# Patient Record
Sex: Male | Born: 1965 | Race: White | Hispanic: No | Marital: Married | Smoking: Former smoker
Health system: Southern US, Community
[De-identification: ages and names within clinical notes are randomized; demographics above are authoritative.]

## PROBLEM LIST (undated history)

## (undated) DIAGNOSIS — I10 Essential (primary) hypertension: Secondary | ICD-10-CM

## (undated) DIAGNOSIS — E785 Hyperlipidemia, unspecified: Secondary | ICD-10-CM

## (undated) DIAGNOSIS — K469 Unspecified abdominal hernia without obstruction or gangrene: Secondary | ICD-10-CM

## (undated) DIAGNOSIS — K219 Gastro-esophageal reflux disease without esophagitis: Secondary | ICD-10-CM

## (undated) HISTORY — DX: Essential (primary) hypertension: I10

## (undated) HISTORY — DX: Unspecified abdominal hernia without obstruction or gangrene: K46.9

## (undated) HISTORY — PX: COLONOSCOPY: SHX174

## (undated) HISTORY — DX: Hyperlipidemia, unspecified: E78.5

## (undated) HISTORY — PX: OTHER SURGICAL HISTORY: SHX169

## (undated) HISTORY — DX: Gastro-esophageal reflux disease without esophagitis: K21.9

## (undated) HISTORY — PX: NOSE SURGERY: SHX723

---

## 1998-10-27 ENCOUNTER — Emergency Department (HOSPITAL_COMMUNITY): Admission: EM | Admit: 1998-10-27 | Discharge: 1998-10-27 | Payer: Self-pay | Admitting: Emergency Medicine

## 1999-11-19 ENCOUNTER — Ambulatory Visit (HOSPITAL_COMMUNITY): Admission: RE | Admit: 1999-11-19 | Discharge: 1999-11-19 | Payer: Self-pay | Admitting: Gastroenterology

## 2001-01-24 ENCOUNTER — Encounter: Admission: RE | Admit: 2001-01-24 | Discharge: 2001-01-24 | Payer: Self-pay | Admitting: Otolaryngology

## 2001-01-24 ENCOUNTER — Encounter: Payer: Self-pay | Admitting: Otolaryngology

## 2001-04-06 ENCOUNTER — Other Ambulatory Visit: Admission: RE | Admit: 2001-04-06 | Discharge: 2001-04-06 | Payer: Self-pay | Admitting: Otolaryngology

## 2002-11-08 ENCOUNTER — Encounter: Payer: Self-pay | Admitting: Family Medicine

## 2002-11-08 ENCOUNTER — Encounter: Admission: RE | Admit: 2002-11-08 | Discharge: 2002-11-08 | Payer: Self-pay | Admitting: Family Medicine

## 2004-06-11 ENCOUNTER — Emergency Department (HOSPITAL_COMMUNITY): Admission: EM | Admit: 2004-06-11 | Discharge: 2004-06-11 | Payer: Self-pay | Admitting: Emergency Medicine

## 2015-03-09 ENCOUNTER — Ambulatory Visit (INDEPENDENT_AMBULATORY_CARE_PROVIDER_SITE_OTHER): Payer: BLUE CROSS/BLUE SHIELD | Admitting: Emergency Medicine

## 2015-03-09 VITALS — BP 142/92 | HR 94 | Temp 97.7°F | Resp 18

## 2015-03-09 DIAGNOSIS — M79641 Pain in right hand: Secondary | ICD-10-CM

## 2015-03-09 DIAGNOSIS — S60551A Superficial foreign body of right hand, initial encounter: Secondary | ICD-10-CM | POA: Diagnosis not present

## 2015-03-09 NOTE — Patient Instructions (Signed)

## 2015-03-09 NOTE — Progress Notes (Signed)
[facility] [address] [place] [address] [phone]  Date:  03/09/2015   Name:  Edgar Hall   DOB:  1966/10/19   MRN:  [id number]  PCP:  No primary care provider on file.    Chief Complaint: fishing hook in hand   History of Present Illness:  EPIMENIO SCHETTER is a 49 y.o. very pleasant male patient who presents with the following:  Has fishhook in right hand. Current on tetanus No improvement with over the counter medications or other home remedies.  Denies other complaint or health concern today.   There are no active problems to display for this patient.   Past Medical History  Diagnosis Date  . Hypertension     Past Surgical History  Procedure Laterality Date  . Arm surgery    . Nose surgery      History  Substance Use Topics  . Smoking status: Former Research scientist (life sciences)  . Smokeless tobacco: Not on file  . Alcohol Use: No    History reviewed. No pertinent family history.  No Known Allergies  Medication list has been reviewed and updated.  No current outpatient prescriptions on file prior to visit.   No current facility-administered medications on file prior to visit.    Review of Systems:  As per HPI, otherwise negative.    Physical Examination: Filed Vitals:   03/09/15 0920  BP: 142/92  Pulse: 94  Temp: 97.7 F (36.5 C)  Resp: 18   There were no vitals filed for this visit. There is no height or weight on file to calculate BMI. Ideal Body Weight:    . GEN: WDWN, NAD, Non-toxic, Alert & Oriented x 3 HEENT: Atraumatic, Normocephalic.  Ears and Nose: No external deformity. EXTR: No clubbing/cyanosis/edema NEURO: Normal gait.  PSYCH: Normally interactive. Conversant. Not depressed or anxious appearing.  Calm demeanor.  Right hand:  FB embedded in right hand palmar surface between 4-5th fingers.  NATI   Assessment and Plan: FB hand Removed atraumatically by needle technique   Signed,  Ellison Carwin,  MD

## 2017-05-25 ENCOUNTER — Encounter: Payer: Self-pay | Admitting: Internal Medicine

## 2017-06-29 ENCOUNTER — Ambulatory Visit: Payer: Self-pay | Admitting: Internal Medicine

## 2018-03-04 ENCOUNTER — Encounter: Payer: Self-pay | Admitting: Gastroenterology

## 2018-04-19 ENCOUNTER — Ambulatory Visit: Payer: BLUE CROSS/BLUE SHIELD | Admitting: Gastroenterology

## 2018-04-19 ENCOUNTER — Encounter: Payer: Self-pay | Admitting: Gastroenterology

## 2018-04-19 VITALS — BP 116/80 | HR 84 | Ht 66.0 in | Wt 186.4 lb

## 2018-04-19 DIAGNOSIS — Z800 Family history of malignant neoplasm of digestive organs: Secondary | ICD-10-CM | POA: Diagnosis not present

## 2018-04-19 DIAGNOSIS — K921 Melena: Secondary | ICD-10-CM

## 2018-04-19 DIAGNOSIS — L309 Dermatitis, unspecified: Secondary | ICD-10-CM

## 2018-04-19 MED ORDER — NA SULFATE-K SULFATE-MG SULF 17.5-3.13-1.6 GM/177ML PO SOLN: 1.0000 | Freq: Once | ORAL | 0 refills | Status: AC

## 2018-04-19 MED ORDER — CLOTRIMAZOLE 1 % EX CREA: 1.0000 "application " | TOPICAL_CREAM | Freq: Two times a day (BID) | CUTANEOUS | 0 refills | Status: DC

## 2018-04-19 NOTE — Patient Instructions (Signed)
We have sent the following medications to your pharmacy for you to pick up at your convenience: Lotrimin to apply rectally 2-3 x daily x 10 days.  You have been scheduled for a colonoscopy. Please follow written instructions given to you at your visit today.  Please pick up your prep supplies at the pharmacy within the next 1-3 days. If you use inhalers (even only as needed), please bring them with you on the day of your procedure. Your physician has requested that you go to www.startemmi.com and enter the access code given to you at your visit today. This web site gives a general overview about your procedure. However, you should still follow specific instructions given to you by our office regarding your preparation for the procedure.  Normal BMI (Body Mass Index- based on height and weight) is between 19 and 25. Your BMI today is Body mass index is 30.09 kg/m. Marland Kitchen Please consider follow up  regarding your BMI with your Primary Care Provider.  Thank you for choosing me and [facility].  Pricilla Riffle. Dagoberto Ligas., MD., Marval Regal

## 2018-04-19 NOTE — Progress Notes (Signed)
History of Present Illness: This is a 52 year old male referred by Loyola Mast, PA-C for the evaluation of hematochezia, family history of colon cancer.  He relates hemorrhoids being diagnosed at colonoscopy about 15 years ago performed by Dr. Collene Mares.  Over the past few years he has had intermittent rectal itching and intermittent scant amounts of blood on tissue paper and these symptoms have not changed.  For the past few months he has noted perianal irritation and itching.  His father developed colon cancer at age 59. Denies weight loss, abdominal pain, constipation, diarrhea, change in stool caliber, melena, nausea, vomiting, dysphagia, reflux symptoms, chest pain.     No Known Allergies Outpatient Medications Prior to Visit  Medication Sig Dispense Refill  . aspirin EC 81 MG tablet Take 81 mg by mouth daily.    . Cholecalciferol (VITAMIN D PO) Take by mouth.    . fluticasone (FLONASE) 50 MCG/ACT nasal spray Place into both nostrils daily.    Marland Kitchen lisinopril-hydrochlorothiazide (PRINZIDE,ZESTORETIC) 20-25 MG per tablet Take 1 tablet by mouth daily.    . Multiple Vitamin (MULTIVITAMIN) tablet Take 1 tablet by mouth daily.    Marland Kitchen omeprazole (PRILOSEC) 40 MG capsule Take 40 mg by mouth daily.    . simvastatin (ZOCOR) 10 MG tablet Take 10 mg by mouth daily.     No facility-administered medications prior to visit.    Past Medical History:  Diagnosis Date  . Abdominal hernia   . GERD (gastroesophageal reflux disease)   . Hyperlipidemia   . Hypertension    Past Surgical History:  Procedure Laterality Date  . arm surgery    . NOSE SURGERY     Social History   Socioeconomic History  . Marital status: Married    Spouse name: Not on file  . Number of children: 1  . Years of education: Not on file  . Highest education level: Not on file  Occupational History  . Not on file  Social Needs  . Financial resource strain: Not on file  . Food insecurity:    Worry: Not on file   Inability: Not on file  . Transportation needs:    Medical: Not on file    Non-medical: Not on file  Tobacco Use  . Smoking status: Former Research scientist (life sciences)  . Smokeless tobacco: Never Used  Substance and Sexual Activity  . Alcohol use: Yes    Alcohol/week: 0.0 oz  . Drug use: Not Currently  . Sexual activity: Not on file  Lifestyle  . Physical activity:    Days per week: Not on file    Minutes per session: Not on file  . Stress: Not on file  Relationships  . Social connections:    Talks on phone: Not on file    Gets together: Not on file    Attends religious service: Not on file    Active member of club or organization: Not on file    Attends meetings of clubs or organizations: Not on file    Relationship status: Not on file  Other Topics Concern  . Not on file  Social History Narrative  . Not on file   Family History  Problem Relation Age of Onset  . Colon cancer Father   . Diabetes Father   . Heart disease Father   . COPD Mother        Review of Systems: Pertinent positive and negative review of systems were noted in the above HPI section. All other review  of systems were otherwise negative.  Physical Exam: General: Well developed, well nourished, no acute distress Head: Normocephalic and atraumatic Eyes:  sclerae anicteric, EOMI Ears: Normal auditory acuity Mouth: No deformity or lesions Neck: Supple, no masses or thyromegaly Lungs: Clear throughout to auscultation Heart: Regular rate and rhythm; no murmurs, rubs or bruits Abdomen: Soft, non tender and non distended. No masses, hepatosplenomegaly or hernias noted. Normal Bowel sounds Rectal: Perianal erythema extending circumferentially about 5 cm from the anus with 2 linear skin fissures, no anal fissure, no internal lesions or tenderness, Hemoccult negative brown stool. Musculoskeletal: Symmetrical with no gross deformities  Skin: No lesions on visible extremities Pulses:  Normal pulses noted Extremities: No  clubbing, cyanosis, edema or deformities noted Neurological: Alert oriented x 4, grossly nonfocal Cervical Nodes:  No significant cervical adenopathy Inguinal Nodes: No significant inguinal adenopathy Psychological:  Alert and cooperative. Normal mood and affect  Assessment and Recommendations:  1. Family history of colon cancer, father at age 62.  Patient turns 52 in June.  Schedule colonoscopy. The risks (including bleeding, perforation, infection, missed lesions, medication reactions and possible hospitalization or surgery if complications occur), benefits, and alternatives to colonoscopy with possible biopsy and possible polypectomy were discussed with the patient and they consent to proceed.   2. Hematochezia, intermittent and small-volume.  Suspected benign anorectal source such as hemorrhoids.  3. Perianal dermatiti - fungal dermatitis.  Lotrimin AF twice daily to 3 times daily for 10 days.   cc: Loyola Mast, PA-C [place], [place] [id number]

## 2018-06-28 ENCOUNTER — Encounter: Payer: Self-pay | Admitting: Gastroenterology

## 2018-06-28 ENCOUNTER — Ambulatory Visit (AMBULATORY_SURGERY_CENTER): Payer: BLUE CROSS/BLUE SHIELD | Admitting: Gastroenterology

## 2018-06-28 VITALS — BP 128/90 | HR 77 | Temp 98.7°F | Resp 16 | Ht 66.0 in | Wt 186.0 lb

## 2018-06-28 DIAGNOSIS — D120 Benign neoplasm of cecum: Secondary | ICD-10-CM

## 2018-06-28 DIAGNOSIS — D124 Benign neoplasm of descending colon: Secondary | ICD-10-CM

## 2018-06-28 DIAGNOSIS — Z1211 Encounter for screening for malignant neoplasm of colon: Secondary | ICD-10-CM | POA: Diagnosis present

## 2018-06-28 DIAGNOSIS — Z800 Family history of malignant neoplasm of digestive organs: Secondary | ICD-10-CM

## 2018-06-28 DIAGNOSIS — K635 Polyp of colon: Secondary | ICD-10-CM

## 2018-06-28 MED ORDER — SODIUM CHLORIDE 0.9 % IV SOLN: 500.0000 mL | Freq: Once | INTRAVENOUS | Status: DC

## 2018-06-28 NOTE — Progress Notes (Signed)
Called to room to assist during endoscopic procedure.  Patient ID and intended procedure confirmed with present staff. Received instructions for my participation in the procedure from the performing physician.  

## 2018-06-28 NOTE — Op Note (Signed)
[facility] Patient Name: Edgar Hall Procedure Date: 06/28/2018 11:30 AM MRN: [id number] Endoscopist: Ladene Artist , MD Age: 52 Referring MD:  Date of Birth: 02-21-1966 Gender: Male Account #: [id number] Procedure:                Colonoscopy Indications:              Screening in patient at increased risk: Family                            history of 1st-degree relative with colorectal                            cancer Medicines:                Monitored Anesthesia Care Procedure:                Pre-Anesthesia Assessment:                           - Prior to the procedure, a History and Physical                            was performed, and patient medications and                            allergies were reviewed. The patient's tolerance of                            previous anesthesia was also reviewed. The risks                            and benefits of the procedure and the sedation                            options and risks were discussed with the patient.                            All questions were answered, and informed consent                            was obtained. Prior Anticoagulants: The patient has                            taken no previous anticoagulant or antiplatelet                            agents. ASA Grade Assessment: II - A patient with                            mild systemic disease. After reviewing the risks                            and benefits, the patient was deemed in  satisfactory condition to undergo the procedure.                           After obtaining informed consent, the colonoscope                            was passed under direct vision. Throughout the                            procedure, the patient's blood pressure, pulse, and                            oxygen saturations were monitored continuously. The                            Model PCF-[id number]) scope was introduced                      through the anus and advanced to the the cecum,                            identified by appendiceal orifice and ileocecal                            valve. The ileocecal valve, appendiceal orifice,                            and rectum were photographed. The quality of the                            bowel preparation was good. The colonoscopy was                            performed without difficulty. The patient tolerated                            the procedure well. Scope In: 11:35:20 AM Scope Out: 11:47:05 AM Scope Withdrawal Time: 0 hours 10 minutes 11 seconds  Total Procedure Duration: 0 hours 11 minutes 45 seconds  Findings:                 The perianal and digital rectal examinations were                            normal.                           Three sessile polyps were found in the descending                            colon (2) and cecum (1). The polyps were 6 to 7 mm                            in size. These polyps were removed with a cold  snare. Resection and retrieval were complete.                           A few small-mouthed diverticula were found in the                            left colon.                           Internal hemorrhoids were found during                            retroflexion. The hemorrhoids were small and Grade                            I (internal hemorrhoids that do not prolapse).                           The exam was otherwise without abnormality on                            direct and retroflexion views. Complications:            No immediate complications. Estimated blood loss:                            None. Estimated Blood Loss:     Estimated blood loss: none. Impression:               - Three 6 to 7 mm polyps in the descending colon                            and in the cecum, removed with a cold snare.                            Resected and retrieved.                           - Mild  left colon diverticulosis.                           - Internal hemorrhoids.                           - The examination was otherwise normal on direct                            and retroflexion views. Recommendation:           - Repeat colonoscopy in 5 years for surveillance.                           - Patient has a contact number available for                            emergencies. The signs and symptoms of potential  delayed complications were discussed with the                            patient. Return to normal activities tomorrow.                            Written discharge instructions were provided to the                            patient.                           - High fiber diet.                           - Continue present medications.                           - Await pathology results. Ladene Artist, MD 06/28/2018 11:50:44 AM This report has been signed electronically.

## 2018-06-28 NOTE — Patient Instructions (Signed)
Handouts Given: Polyps, and  Hemorrhoids.  YOU HAD AN ENDOSCOPIC PROCEDURE TODAY AT [facility]:   Refer to the procedure report that was given to you for any specific questions about what was found during the examination.  If the procedure report does not answer your questions, please call your gastroenterologist to clarify.  If you requested that your care partner not be given the details of your procedure findings, then the procedure report has been included in a sealed envelope for you to review at your convenience later.  YOU SHOULD EXPECT: Some feelings of bloating in the abdomen. Passage of more gas than usual.  Walking can help get rid of the air that was put into your GI tract during the procedure and reduce the bloating. If you had a lower endoscopy (such as a colonoscopy or flexible sigmoidoscopy) you may notice spotting of blood in your stool or on the toilet paper. If you underwent a bowel prep for your procedure, you may not have a normal bowel movement for a few days.  Please Note:  You might notice some irritation and congestion in your nose or some drainage.  This is from the oxygen used during your procedure.  There is no need for concern and it should clear up in a day or so.  SYMPTOMS TO REPORT IMMEDIATELY:   Following lower endoscopy (colonoscopy or flexible sigmoidoscopy):  Excessive amounts of blood in the stool  Significant tenderness or worsening of abdominal pains  Swelling of the abdomen that is new, acute  Fever of 100F or higher   For urgent or emergent issues, a gastroenterologist can be reached at any hour by calling [phone].   DIET:  We do recommend a small meal at first, but then you may proceed to your regular diet.  Drink plenty of fluids but you should avoid alcoholic beverages for 24 hours.  ACTIVITY:  You should plan to take it easy for the rest of today and you should NOT DRIVE or use heavy machinery until tomorrow (because of  the sedation medicines used during the test).    FOLLOW UP: Our staff will call the number listed on your records the next business day following your procedure to check on you and address any questions or concerns that you may have regarding the information given to you following your procedure. If we do not reach you, we will leave a message.  However, if you are feeling well and you are not experiencing any problems, there is no need to return our call.  We will assume that you have returned to your regular daily activities without incident.  If any biopsies were taken you will be contacted by phone or by letter within the next 1-3 weeks.  Please call us at [phone] if you have not heard about the biopsies in 3 weeks.    SIGNATURES/CONFIDENTIALITY: You and/or your care partner have signed paperwork which will be entered into your electronic medical record.  These signatures attest to the fact that that the information above on your After Visit Summary has been reviewed and is understood.  Full responsibility of the confidentiality of this discharge information lies with you and/or your care-partner.

## 2018-06-28 NOTE — Progress Notes (Signed)
A and O x3. Report to RN. Tolerated MAC anesthesia well.

## 2018-06-28 NOTE — Progress Notes (Signed)
Updated history with patient today.

## 2018-06-29 ENCOUNTER — Telehealth: Payer: Self-pay

## 2018-06-29 NOTE — Telephone Encounter (Signed)
  Follow up Call-  Call Edgar Hall number 06/28/2018  Post procedure Call [name] phone  # [phone]  Permission to leave phone message Yes  Some recent data might be hidden     Patient questions:  Do you have a fever, pain , or abdominal swelling? No. Pain Score  0 *  Have you tolerated food without any problems? Yes.    Have you been able to return to your normal activities? Yes.    Do you have any questions about your discharge instructions: Diet   No. Medications  No. Follow up visit  No.  Do you have questions or concerns about your Care? No.  Actions: * If pain score is 4 or above: No action needed, pain <4.

## 2018-07-05 ENCOUNTER — Encounter: Payer: Self-pay | Admitting: Gastroenterology

## 2019-01-21 DIAGNOSIS — I639 Cerebral infarction, unspecified: Secondary | ICD-10-CM

## 2019-01-21 HISTORY — DX: Cerebral infarction, unspecified: I63.9

## 2019-02-10 ENCOUNTER — Emergency Department (HOSPITAL_COMMUNITY): Payer: BLUE CROSS/BLUE SHIELD

## 2019-02-10 ENCOUNTER — Other Ambulatory Visit: Payer: Self-pay

## 2019-02-10 ENCOUNTER — Encounter (HOSPITAL_COMMUNITY): Payer: Self-pay | Admitting: Internal Medicine

## 2019-02-10 ENCOUNTER — Observation Stay (HOSPITAL_COMMUNITY)
Admission: EM | Admit: 2019-02-10 | Discharge: 2019-02-11 | Disposition: A | Discharge status: Home / Self Care | Payer: BLUE CROSS/BLUE SHIELD | Attending: Internal Medicine | Admitting: Internal Medicine

## 2019-02-10 DIAGNOSIS — K219 Gastro-esophageal reflux disease without esophagitis: Secondary | ICD-10-CM | POA: Diagnosis present

## 2019-02-10 DIAGNOSIS — E785 Hyperlipidemia, unspecified: Secondary | ICD-10-CM | POA: Diagnosis present

## 2019-02-10 DIAGNOSIS — R945 Abnormal results of liver function studies: Secondary | ICD-10-CM | POA: Diagnosis not present

## 2019-02-10 DIAGNOSIS — R0789 Other chest pain: Secondary | ICD-10-CM | POA: Diagnosis present

## 2019-02-10 DIAGNOSIS — I10 Essential (primary) hypertension: Secondary | ICD-10-CM | POA: Diagnosis not present

## 2019-02-10 DIAGNOSIS — R079 Chest pain, unspecified: Secondary | ICD-10-CM | POA: Insufficient documentation

## 2019-02-10 DIAGNOSIS — Z87891 Personal history of nicotine dependence: Secondary | ICD-10-CM | POA: Insufficient documentation

## 2019-02-10 DIAGNOSIS — R531 Weakness: Secondary | ICD-10-CM | POA: Diagnosis not present

## 2019-02-10 DIAGNOSIS — R42 Dizziness and giddiness: Secondary | ICD-10-CM | POA: Diagnosis not present

## 2019-02-10 DIAGNOSIS — G459 Transient cerebral ischemic attack, unspecified: Secondary | ICD-10-CM

## 2019-02-10 DIAGNOSIS — R202 Paresthesia of skin: Secondary | ICD-10-CM

## 2019-02-10 DIAGNOSIS — Z79899 Other long term (current) drug therapy: Secondary | ICD-10-CM | POA: Insufficient documentation

## 2019-02-10 DIAGNOSIS — R200 Anesthesia of skin: Secondary | ICD-10-CM | POA: Diagnosis not present

## 2019-02-10 DIAGNOSIS — R7989 Other specified abnormal findings of blood chemistry: Secondary | ICD-10-CM | POA: Diagnosis present

## 2019-02-10 LAB — COMPREHENSIVE METABOLIC PANEL
ALT: 151 U/L — ABNORMAL HIGH (ref 0–44)
AST: 60 U/L — ABNORMAL HIGH (ref 15–41)
Albumin: 3.9 g/dL (ref 3.5–5.0)
Alkaline Phosphatase: 43 U/L (ref 38–126)
Anion gap: 12 (ref 5–15)
BUN: 12 mg/dL (ref 6–20)
CHLORIDE: 99 mmol/L (ref 98–111)
CO2: 23 mmol/L (ref 22–32)
Calcium: 9.2 mg/dL (ref 8.9–10.3)
Creatinine, Ser: 1.11 mg/dL (ref 0.61–1.24)
GFR calc Af Amer: >60 mL/min (ref >60)
Glucose, Bld: 105 mg/dL — ABNORMAL HIGH (ref 70–99)
Potassium: 3.1 mmol/L — ABNORMAL LOW (ref 3.5–5.1)
Sodium: 134 mmol/L — ABNORMAL LOW (ref 135–145)
Total Bilirubin: 0.7 mg/dL (ref 0.3–1.2)
Total Protein: 7 g/dL (ref 6.5–8.1)

## 2019-02-10 LAB — RAPID URINE DRUG SCREEN, HOSP PERFORMED
Amphetamines: NOT DETECTED
Barbiturates: NOT DETECTED
Benzodiazepines: NOT DETECTED
Cocaine: NOT DETECTED
Opiates: NOT DETECTED
Tetrahydrocannabinol: NOT DETECTED

## 2019-02-10 LAB — DIFFERENTIAL
Abs Immature Granulocytes: 0.06 10*3/uL (ref 0.00–0.07)
Basophils Absolute: 0.1 10*3/uL (ref 0.0–0.1)
Basophils Relative: 1 %
Eosinophils Absolute: 0.1 10*3/uL (ref 0.0–0.5)
Eosinophils Relative: 2 %
Immature Granulocytes: 1 %
Lymphocytes Relative: 39 %
Lymphs Abs: 3.5 10*3/uL (ref 0.7–4.0)
MONO ABS: 1.1 10*3/uL — AB (ref 0.1–1.0)
Monocytes Relative: 12 %
Neutro Abs: 4.2 10*3/uL (ref 1.7–7.7)
Neutrophils Relative %: 45 %

## 2019-02-10 LAB — CBC
HCT: 47.6 % (ref 39.0–52.0)
HEMOGLOBIN: 16 g/dL (ref 13.0–17.0)
MCH: 30.6 pg (ref 26.0–34.0)
MCHC: 33.6 g/dL (ref 30.0–36.0)
MCV: 91 fL (ref 80.0–100.0)
Platelets: 258 10*3/uL (ref 150–400)
RBC: 5.23 MIL/uL (ref 4.22–5.81)
RDW: 11.9 % (ref 11.5–15.5)
WBC: 9 10*3/uL (ref 4.0–10.5)
nRBC: 0 % (ref 0.0–0.2)

## 2019-02-10 LAB — URINALYSIS, ROUTINE W REFLEX MICROSCOPIC
Bacteria, UA: NONE SEEN
Bilirubin Urine: NEGATIVE
Glucose, UA: NEGATIVE mg/dL
KETONES UR: NEGATIVE mg/dL
Leukocytes,Ua: NEGATIVE
Nitrite: NEGATIVE
Protein, ur: NEGATIVE mg/dL
Specific Gravity, Urine: 1.005 (ref 1.005–1.030)
pH: 7 (ref 5.0–8.0)

## 2019-02-10 LAB — PROTIME-INR
INR: 0.97
Prothrombin Time: 12.8 seconds (ref 11.4–15.2)

## 2019-02-10 LAB — TROPONIN I

## 2019-02-10 LAB — ETHANOL

## 2019-02-10 LAB — APTT: aPTT: 26 seconds (ref 24–36)

## 2019-02-10 MED ORDER — LORAZEPAM 2 MG/ML IJ SOLN
1.0000 mg | Freq: Once | INTRAMUSCULAR | Status: AC
  Administered 2019-02-10: 1 mg via INTRAVENOUS
  Filled 2019-02-10: qty 1

## 2019-02-10 MED ORDER — ASPIRIN EC 81 MG PO TBEC
81.0000 mg | DELAYED_RELEASE_TABLET | Freq: Once | ORAL | Status: AC
  Administered 2019-02-10: 81 mg via ORAL
  Filled 2019-02-10: qty 1

## 2019-02-10 MED ORDER — ONDANSETRON HCL 4 MG/2ML IJ SOLN
INTRAMUSCULAR | Status: AC
  Filled 2019-02-10: qty 2

## 2019-02-10 MED ORDER — LORAZEPAM 2 MG/ML IJ SOLN
0.5000 mg | Freq: Once | INTRAMUSCULAR | Status: AC | PRN
  Administered 2019-02-10: 0.5 mg via INTRAVENOUS
  Filled 2019-02-10: qty 1

## 2019-02-10 NOTE — ED Notes (Signed)
Patient transported to MRI 

## 2019-02-10 NOTE — H&P (Signed)
History and Physical    Edgar Hall UVO:[id number] DOB: 1966-01-01 DOA: 02/10/2019  Referring MD/NP/PA:   PCP: [facility].   Patient coming from:  The patient is coming from home.  At baseline, pt is independent for most of ADL.        Chief Complaint: Left-sided numbness and weakness  HPI: Edgar Hall is a 53 y.o. male with medical history significant of hypertension, hyperlipidemia, GERD, ventral hernia who presents with left-sided numbness and weakness.  Patient states that his symptoms started at about 3:30-4:00 this afternoon, including left-sided weakness, numbness and tingling.  Symptoms lasted for about 3 hours, then gradually resolved. Per wife, patient was in his truck and started feeling "swimmy-headed", feels like in an unsteady on a boat. He felt nauseated, and had one episode of chest pressure which has resolved. He did not pass out. He also has right frontal headache.  No vision loss or hearing loss.  No facial droop, slurred speech or difficulty swallowing.  Patient denies chest pain, cough, fever or chills.  No abdominal pain, vomiting or diarrhea.  Patient states that she drinks 2 beers daily. Per EMS, pt had elevated blood pressure 220/130,   ED Course: pt was found to have WBC 9.0, negative troponin, INR 0.97, PTT 26, alcohol level less than 10, potassium 3.1, creatinine 1.11, BUN 12, abnormal liver function (ALP 43, AST 60, ALT 150, total bilirubin 0.7), no tachycardia, oxygen saturation 97% on room air.  CT head is negative for acute intracranial abnormalities.  MRI/MRA unremarkable.  Patient is placed on telemetry bed for observation.  Neurology, Dr. Cheral Marker was consulted.  Review of Systems:   General: no fevers, chills, no body weight gain, has fatigue and HA HEENT: no blurry vision, hearing changes or sore throat Respiratory: no dyspnea, coughing, wheezing CV: has chest pressure,  no palpitations GI: has nausea, no vomiting, abdominal pain,  diarrhea, constipation GU: no dysuria, burning on urination, increased urinary frequency, hematuria  Ext: no leg edema Neuro:  left-sided weakness, numbness and tingling. Skin: no rash, no skin tear. MSK: No muscle spasm, no deformity, no limitation of range of movement in spin Heme: No easy bruising.  Travel history: No recent long distant travel.  Allergy: No Known Allergies  Past Medical History:  Diagnosis Date  . Abdominal hernia   . GERD (gastroesophageal reflux disease)   . Hyperlipidemia   . Hypertension     Past Surgical History:  Procedure Laterality Date  . arm surgery    . COLONOSCOPY    . NOSE SURGERY      Social History:  reports that he has quit smoking. He has never used smokeless tobacco. He reports current alcohol use. He reports previous drug use.  Family History:  Family History  Problem Relation Age of Onset  . Colon cancer Father   . Diabetes Father   . Heart disease Father   . COPD Mother      Prior to Admission medications   Medication Sig Start Date End Date Taking? Authorizing Provider  aspirin EC 81 MG tablet Take 81 mg by mouth daily.   Yes [provider]  Cholecalciferol (VITAMIN D PO) Take 1 tablet by mouth daily.    Yes [provider]  fluticasone (FLONASE) 50 MCG/ACT nasal spray Place 1 spray into both nostrils daily as needed for allergies or rhinitis.    Yes [provider]  lisinopril-hydrochlorothiazide (PRINZIDE,ZESTORETIC) 20-25 MG per tablet Take 1 tablet by mouth daily.  Yes [provider]  Multiple Vitamin (MULTIVITAMIN) tablet Take 1 tablet by mouth daily.   Yes [provider]  omeprazole (PRILOSEC) 40 MG capsule Take 40 mg by mouth daily.   Yes [provider]  simvastatin (ZOCOR) 10 MG tablet Take 10 mg by mouth daily.   Yes [provider]    Physical Exam: Vitals:   02/11/19 0015 02/11/19 0030 02/11/19 0045 02/11/19 0210  BP: 101/81 112/87 114/87 (!)  134/96  Pulse: 68 76 61 70  Resp: 15 18 12 16   Temp:    98.1 F (36.7 C)  TempSrc:    Oral  SpO2: 96% 98% 96% 100%  Weight:       General: Not in acute distress HEENT:       Eyes: PERRL, EOMI, no scleral icterus.       ENT: No discharge from the ears and nose, no pharynx injection, no tonsillar enlargement.        Neck: No JVD, no bruit, no mass felt. Heme: No neck lymph node enlargement. Cardiac: S1/S2, RRR, No murmurs, No gallops or rubs. Respiratory: No rales, wheezing, rhonchi or rubs. GI: Soft, nondistended, nontender, no rebound pain, no organomegaly, BS present. GU: No hematuria Ext: No pitting leg edema bilaterally. 2+DP/PT pulse bilaterally. Musculoskeletal: No joint deformities, No joint redness or warmth, no limitation of ROM in spin. Skin: No rashes.  Neuro: currently, Alert, oriented X3, cranial nerves II-XII grossly intact, moves all extremities normally. Muscle strength 5/5 in all extremities, sensation to light touch intact. Brachial reflex 2+ bilaterally.  Negative Babinski's sign. Normal finger to nose test. Psych: Patient is not psychotic, no suicidal or hemocidal ideation.  Labs on Admission: I have personally reviewed following labs and imaging studies  CBC: Recent Labs  Lab 02/10/19 1945  WBC 9.0  NEUTROABS 4.2  HGB 16.0  HCT 47.6  MCV 91.0  PLT 834   Basic Metabolic Panel: Recent Labs  Lab 02/10/19 1945  NA 134*  K 3.1*  CL 99  CO2 23  GLUCOSE 105*  BUN 12  CREATININE 1.11  CALCIUM 9.2   GFR: CrCl cannot be calculated (Unknown ideal weight.). Liver Function Tests: Recent Labs  Lab 02/10/19 1945  AST 60*  ALT 151*  ALKPHOS 43  BILITOT 0.7  PROT 7.0  ALBUMIN 3.9   No results for input(s): LIPASE, AMYLASE in the last 168 hours. No results for input(s): AMMONIA in the last 168 hours. Coagulation Profile: Recent Labs  Lab 02/10/19 1945  INR 0.97   Cardiac Enzymes: Recent Labs  Lab 02/10/19 2100  TROPONINI <0.03   BNP  (last 3 results) No results for input(s): PROBNP in the last 8760 hours. HbA1C: No results for input(s): HGBA1C in the last 72 hours. CBG: No results for input(s): GLUCAP in the last 168 hours. Lipid Profile: No results for input(s): CHOL, HDL, LDLCALC, TRIG, CHOLHDL, LDLDIRECT in the last 72 hours. Thyroid Function Tests: No results for input(s): TSH, T4TOTAL, FREET4, T3FREE, THYROIDAB in the last 72 hours. Anemia Panel: No results for input(s): VITAMINB12, FOLATE, FERRITIN, TIBC, IRON, RETICCTPCT in the last 72 hours. Urine analysis:    Component Value Date/Time   COLORURINE STRAW (A) 02/10/2019 2240   APPEARANCEUR CLEAR 02/10/2019 2240   LABSPEC 1.005 02/10/2019 2240   PHURINE 7.0 02/10/2019 2240   GLUCOSEU NEGATIVE 02/10/2019 2240   HGBUR SMALL (A) 02/10/2019 2240   BILIRUBINUR NEGATIVE 02/10/2019 [place] 02/10/2019 2240   PROTEINUR NEGATIVE 02/10/2019 2240  NITRITE NEGATIVE 02/10/2019 [place] 02/10/2019 2240   Sepsis Labs: @LABRCNTIP (procalcitonin:4,lacticidven:4) )No results found for this or any previous visit (from the past 240 hour(s)).   Radiological Exams on Admission: Mr Virgel Paling YN Contrast  Result Date: 02/11/2019 CLINICAL DATA:  53 y/o M; numbness and tingling of left upper and lower extremities. EXAM: MRI HEAD WITHOUT CONTRAST MRA HEAD WITHOUT CONTRAST TECHNIQUE: Multiplanar, multiecho pulse sequences of the brain and surrounding structures were obtained without intravenous contrast. Angiographic images of the head were obtained using MRA technique without contrast. COMPARISON:  02/10/2019 CT head FINDINGS: MRI HEAD FINDINGS Brain: No acute infarction, hemorrhage, hydrocephalus, extra-axial collection or mass lesion. No structural or signal abnormality of the brain. Vascular: Normal flow voids. Skull and upper cervical spine: Normal marrow signal. Sinuses/Orbits: Tiny right maxillary sinus mucous retention cyst. Otherwise  visible paranasal sinuses and the mastoid air cells demonstrate normal signal. Orbits are unremarkable. Other: None. MRA HEAD FINDINGS Mild motion artifact. Internal carotid arteries:  Patent. Anterior cerebral arteries:  Patent. Middle cerebral arteries: Patent. Anterior communicating artery: Patent. Posterior communicating arteries:  Right fetal PCA. Posterior cerebral arteries:  Patent. Basilar artery:  Patent. Vertebral arteries:  Patent. No high-grade stenosis, large vessel occlusion, or aneurysm identified. IMPRESSION: 1. No acute intracranial abnormality. Unremarkable MRI of the brain. 2. Mild motion degradation of MRA of the head. No intracranial large vessel occlusion, aneurysm, or high-grade stenosis. Electronically Signed   By: Kristine Garbe M.D.   On: 02/11/2019 00:00   Mr Brain Wo Contrast  Result Date: 02/11/2019 CLINICAL DATA:  53 y/o M; numbness and tingling of left upper and lower extremities. EXAM: MRI HEAD WITHOUT CONTRAST MRA HEAD WITHOUT CONTRAST TECHNIQUE: Multiplanar, multiecho pulse sequences of the brain and surrounding structures were obtained without intravenous contrast. Angiographic images of the head were obtained using MRA technique without contrast. COMPARISON:  02/10/2019 CT head FINDINGS: MRI HEAD FINDINGS Brain: No acute infarction, hemorrhage, hydrocephalus, extra-axial collection or mass lesion. No structural or signal abnormality of the brain. Vascular: Normal flow voids. Skull and upper cervical spine: Normal marrow signal. Sinuses/Orbits: Tiny right maxillary sinus mucous retention cyst. Otherwise visible paranasal sinuses and the mastoid air cells demonstrate normal signal. Orbits are unremarkable. Other: None. MRA HEAD FINDINGS Mild motion artifact. Internal carotid arteries:  Patent. Anterior cerebral arteries:  Patent. Middle cerebral arteries: Patent. Anterior communicating artery: Patent. Posterior communicating arteries:  Right fetal PCA. Posterior  cerebral arteries:  Patent. Basilar artery:  Patent. Vertebral arteries:  Patent. No high-grade stenosis, large vessel occlusion, or aneurysm identified. IMPRESSION: 1. No acute intracranial abnormality. Unremarkable MRI of the brain. 2. Mild motion degradation of MRA of the head. No intracranial large vessel occlusion, aneurysm, or high-grade stenosis. Electronically Signed   By: Kristine Garbe M.D.   On: 02/11/2019 00:00   Ct Head Code Stroke Wo Contrast  Result Date: 02/10/2019 CLINICAL DATA:  Code stroke.  Dizziness left arm numbness EXAM: CT HEAD WITHOUT CONTRAST TECHNIQUE: Contiguous axial images were obtained from the base of the skull through the vertex without intravenous contrast. COMPARISON:  None. FINDINGS: Brain: No evidence of acute infarction, hemorrhage, hydrocephalus, extra-axial collection or mass lesion/mass effect. Vascular: Negative for hyperdense vessel Skull: Negative Sinuses/Orbits: Negative Other: None ASPECTS ([place] Stroke Program Early CT Score) - Ganglionic level infarction (caudate, lentiform nuclei, internal capsule, insula, M1-M3 cortex): 7 - Supraganglionic infarction (M4-M6 cortex): 3 Total score (0-10 with 10 being normal): 10 IMPRESSION: 1. Negative CT head 2. ASPECTS is 10 3.  These results were called by telephone at the time of interpretation on 02/10/2019 at 7:55 pm to Dr. Cheral Marker , who verbally acknowledged these results. Electronically Signed   By: Franchot Gallo M.D.   On: 02/10/2019 19:56     EKG: Independently reviewed.  Sinus rhythm, QTC 435, low voltage, nonspecific T wave change  Assessment/Plan Principal Problem:   Left sided numbness Active Problems:   GERD (gastroesophageal reflux disease)   Hyperlipidemia   Hypertension   Abnormal LFTs   Chest pressure   Left-sided weakness   Left sided numbness and weakness: Etiology is not clear.  Potential differential diagnosis is TIA.  Neurology was consulted, Dr. Cheral Marker evaluated patient.  Per  Dr. Cheral Marker, "his presentation is more consistent with chest pain/cardiac etiology or anxiety than TIA, would hold off on permissive HTN and manage BP per standard protocols".  Dr. Cheral Marker recommended TIA work-up.  -Will place on telemetry bed for observation -Highly appreciated Dr. Yvetta Coder recommendation with follow-up recommendations as follows:  Recommendations: 1. Chest pain work up per ED protocol 2. MRI, MRA of the brain without contrast-->results are not remarkable 3. Cardiac telemetry 4. TTE 5. Carotid ultrasound 6. Prophylactic therapy- Start ASA 81 mg po qd 7. Risk factor modification 8. PT consult, OT consult, Speech consult 9. Frequent neuro checks 10. Given that his presentation is more consistent with chest pain/cardiac etiology or anxiety than TIA, would hold off on permissive HTN and manage BP per standard protocols 11. HgbA1c, fasting lipid panel  GERD (gastroesophageal reflux disease): -Protonix  Hyperlipidemia: -continue Zocor (due to concerning for TIA, will continue Zocor).  If liver function gets worse may need to hold off Zocor  HTN:  -Continue home medications: Prinzide -IV hydralazine prn  Abnormal LFTs: ALP 43, AST 60, ALT 150, total bilirubin 0.7.  Etiology is not clear.  Patient drinks 2 beers every day, but the ratio of AST/ALT is not consistent with alcohol toxicity.  Patient denies taking Tylenol. -Check hepatitis panel - HIV antibody -Avoid using liver toxic medication, such as Tylenol  Chest pressure: Symptoms has resolved.  May be had demand ischemia secondary to elevated blood pressure and possible TIA - cycle CE q6 x3 and repeat EKG in the am  - prn Nitroglycerin, Morphine, and aspirin, zocor - Risk factor stratification: will check FLP and A1C, UDS  - 2d echo  Alcohol use: Patient drinks 2 beers daily.  Currently no signs for withdrawal. -Observe for signs of withdrawal closely.  If shows any signs of withdrawal--> will start CIWA  protocol.   DVT ppx: SQ Lovenox Code Status: Full code Family Communication: Yes, patient's wife at bed side Disposition Plan:  Anticipate discharge back to previous home environment Consults called:  Dr. Cheral Marker of neuro Admission status: Obs / tele    Date of Service 02/11/2019    [facility]   If 7PM-7AM, please contact night-coverage [email or web address] Password [email or web address] 02/11/2019, 3:20 AM

## 2019-02-10 NOTE — ED Triage Notes (Signed)
Pt brought in by [facility] with c/o "swimmy-headedness," chest pressure and numbness and tingling down left arm and leg with pressure on the right side of his head.

## 2019-02-10 NOTE — ED Provider Notes (Signed)
[place] EMERGENCY DEPARTMENT Provider Note   CSN: [id number] Arrival date & time: 02/10/19  1943  An emergency department physician performed an initial assessment on this suspected stroke patient at 3.  History   Chief Complaint Chief Complaint  Patient presents with  . code stroke    HPI Edgar Hall is a 53 y.o. male with PMH significant for HTN and HLD.      Patient states that he was in his usual state of health around 3:30pm today. He started to feel "swimmy headed, like I was going to pass out" with L arm and leg numbness. Had some associated nausea. He states that his L fingers felt a little tingling as well. He also had some L sided chest pain at the time that was not associated with SOB or diaphoresis. No LOC. He was breathing fast at the time but thinks that was due to feeling anxious. No acute changes in vision, has been having L eye blurry vision for the past 1 month. No slurred speech or facial droop.        Past Medical History:  Diagnosis Date  . Abdominal hernia   . GERD (gastroesophageal reflux disease)   . Hyperlipidemia   . Hypertension     There are no active problems to display for this patient.   Past Surgical History:  Procedure Laterality Date  . arm surgery    . COLONOSCOPY    . NOSE SURGERY          Home Medications    Prior to Admission medications   Medication Sig Start Date End Date Taking? Authorizing Provider  aspirin EC 81 MG tablet Take 81 mg by mouth daily.    [provider]  Cholecalciferol (VITAMIN D PO) Take by mouth.    [provider]  clotrimazole (LOTRIMIN AF) 1 % cream Apply 1 application topically 2 (two) times daily. Apply 2-3 x daily x 10 days 04/19/18   Ladene Artist, MD  fluticasone [facility]) 50 MCG/ACT nasal spray Place into both nostrils daily.    [provider]  lisinopril-hydrochlorothiazide (PRINZIDE,ZESTORETIC) 20-25 MG per tablet Take 1 tablet by  mouth daily.    [provider]  Multiple Vitamin (MULTIVITAMIN) tablet Take 1 tablet by mouth daily.    [provider]  omeprazole (PRILOSEC) 40 MG capsule Take 40 mg by mouth daily.    [provider]  simvastatin (ZOCOR) 10 MG tablet Take 10 mg by mouth daily.    [provider]    Family History Family History  Problem Relation Age of Onset  . Colon cancer Father   . Diabetes Father   . Heart disease Father   . COPD Mother     Social History Social History   Tobacco Use  . Smoking status: Former Research scientist (life sciences)  . Smokeless tobacco: Never Used  Substance Use Topics  . Alcohol use: Yes    Alcohol/week: 0.0 standard drinks  . Drug use: Not Currently     Allergies   Patient has no known allergies.   Review of Systems Review of Systems  Constitutional: Negative for diaphoresis and fatigue.  HENT: Negative for trouble swallowing and voice change.   Respiratory: Negative for shortness of breath and wheezing.   Cardiovascular: Positive for chest pain. Negative for palpitations.  Gastrointestinal: Positive for nausea. Negative for abdominal pain, diarrhea and vomiting.  Neurological: Positive for dizziness and numbness. Negative for syncope, facial asymmetry, speech difficulty and weakness.  Physical Exam Updated Vital Signs BP 138/85   Pulse 81   Resp 11   Wt 86.1 kg   SpO2 97%   BMI 30.63 kg/m   Physical Exam Constitutional:      General: He is not in acute distress.    Appearance: Normal appearance. He is not diaphoretic.  HENT:     Head: Normocephalic and atraumatic.     Nose: Nose normal.     Mouth/Throat:     Mouth: Mucous membranes are moist.     Pharynx: Oropharynx is clear. No oropharyngeal exudate or posterior oropharyngeal erythema.  Eyes:     Extraocular Movements: Extraocular movements intact.     Conjunctiva/sclera: Conjunctivae normal.     Pupils: Pupils are equal, round, and reactive to light.  Neck:      Musculoskeletal: Normal range of motion and neck supple.  Cardiovascular:     Rate and Rhythm: Normal rate and regular rhythm.     Heart sounds: Normal heart sounds. No murmur.  Pulmonary:     Effort: Pulmonary effort is normal.     Breath sounds: Normal breath sounds. No wheezing or rhonchi.  Abdominal:     General: Abdomen is flat. Bowel sounds are normal.     Tenderness: There is no abdominal tenderness.  Musculoskeletal: Normal range of motion.  Skin:    General: Skin is warm and dry.     Capillary Refill: Capillary refill takes less than 2 seconds.  Neurological:     Mental Status: He is alert and oriented to person, place, and time.     Cranial Nerves: Cranial nerves are intact. No cranial nerve deficit, dysarthria or facial asymmetry.     Sensory: Sensation is intact. No sensory deficit.     Motor: Motor function is intact. No weakness or abnormal muscle tone.     Coordination: Finger-Nose-Finger Test and Heel to [employer] normal.      ED Treatments / Results  Labs (all labs ordered are listed, but only abnormal results are displayed) Labs Reviewed  DIFFERENTIAL - Abnormal; Notable for the following components:      Result Value   Monocytes Absolute 1.1 (*)    All other components within normal limits  COMPREHENSIVE METABOLIC PANEL - Abnormal; Notable for the following components:   Sodium 134 (*)    Potassium 3.1 (*)    Glucose, Bld 105 (*)    AST 60 (*)    ALT 151 (*)    All other components within normal limits  ETHANOL  PROTIME-INR  APTT  CBC  RAPID URINE DRUG SCREEN, HOSP PERFORMED  URINALYSIS, ROUTINE W REFLEX MICROSCOPIC    EKG None  Radiology Ct Head Code Stroke Wo Contrast  Result Date: 02/10/2019 CLINICAL DATA:  Code stroke.  Dizziness left arm numbness EXAM: CT HEAD WITHOUT CONTRAST TECHNIQUE: Contiguous axial images were obtained from the base of the skull through the vertex without intravenous contrast. COMPARISON:  None. FINDINGS: Brain: No  evidence of acute infarction, hemorrhage, hydrocephalus, extra-axial collection or mass lesion/mass effect. Vascular: Negative for hyperdense vessel Skull: Negative Sinuses/Orbits: Negative Other: None ASPECTS ([place] Stroke Program Early CT Score) - Ganglionic level infarction (caudate, lentiform nuclei, internal capsule, insula, M1-M3 cortex): 7 - Supraganglionic infarction (M4-M6 cortex): 3 Total score (0-10 with 10 being normal): 10 IMPRESSION: 1. Negative CT head 2. ASPECTS is 10 3. These results were called by telephone at the time of interpretation on 02/10/2019 at 7:55 pm to Dr. Cheral Marker , who verbally acknowledged these  results. Electronically Signed   By: Franchot Gallo M.D.   On: 02/10/2019 19:56    Procedures Procedures (including critical care time)  Medications Ordered in ED Medications  ondansetron (ZOFRAN) 4 MG/2ML injection (has no administration in time range)  LORazepam (ATIVAN) injection 1 mg (1 mg Intravenous Given 02/10/19 2023)     Initial Impression / Assessment and Plan / ED Course  I have reviewed the triage vital signs and the nursing notes.  Pertinent labs & imaging results that were available during my care of the patient were reviewed by me and considered in my medical decision making (see chart for details).        Patient presented with L sided numbness and tingling and chest pain. EKG showed NSR and troponin negative x1.  Patient chest pain resolved. Second troponin pending. His neuro exam was nonfocal. CT head negative. Neurology evaluated patient and recommended MRI MRA brain. Patient will need observation overnight with additional neurology work up. Hospitalist will admit.    Final Clinical Impressions(s) / ED Diagnoses   Final diagnoses:  Paresthesias  Chest pain, unspecified type    ED Discharge Orders    None       Bufford Lope, DO 02/10/19 2317    Elnora Morrison, MD 02/11/19 0028

## 2019-02-10 NOTE — Consult Note (Signed)
Referring Physician: Dr. Reather Converse   Chief Complaint: Left sided paresthesia  HPI: Edgar Hall is an 53 y.o. male presenting to the ED via EMS as a Code Stroke. LKN is the same as TOSO, estimated at occurring at between 3:30 and 4:00 PM. The patient was in his truck and started feeling "swimmy-headed" as though unsteady on a boat and also with presyncopal sensation with nausea, left sided chest pressure. He then felt LUE numbness that propagated from distal to proximal, then traveled down his LLE with a tingling, warm feeling. Also had right frontal head pressure sensation with perioral, nasal and perinasal tingling in a mask-like distribution. Denies vision problems, trouble with language, swallowing problem, SOB, fever, limb weakness or limb pain. Per EMS, he was very anxious on scene with BP of 220/130, CBG 120, POX 100%on RA, RR 26. Rhythm strip was unremarkable per EMS. EKG in the ED is pending.   CT head was normal with no hemorrhage, hypodensity or atrophy.   LSN: 3:30 PM was earliest LKN tPA Given: No: NIHSS of 0  Past Medical History:  Diagnosis Date  . Abdominal hernia   . GERD (gastroesophageal reflux disease)   . Hyperlipidemia   . Hypertension     Past Surgical History:  Procedure Laterality Date  . arm surgery    . COLONOSCOPY    . NOSE SURGERY      Family History  Problem Relation Age of Onset  . Colon cancer Father   . Diabetes Father   . Heart disease Father   . COPD Mother    Social History:  reports that he has quit smoking. He has never used smokeless tobacco. He reports current alcohol use. He reports previous drug use.  Allergies: No Known Allergies  Home Medications:    ROS: As per HPI. All other systems negative.   Physical Examination: There were no vitals taken for this visit.  HEENT: [place]/AT Lungs: Respirations unlabored currently in CT Ext: No edema  Neurologic Examination: Mental Status: Alert, fully oriented, thought content appropriate.   Speech fluent without evidence of aphasia.  Able to follow a directional 3 step command without difficulty. Cranial Nerves: II:  Visual fields intact with no extinction to DSS. PERRL. III,IV, VI: EOMI without nystagmus. No ptosis.  V,VII: Smile symmetric, facial temp sensation equal bilaterally VIII: hearing intact to voice IX,X: Palate rises symmetrically XI: Symmetric shoulder shrug XII: Midline tongue extension  Motor: Right : Upper extremity   5/5    Left:     Upper extremity   5/5  Lower extremity   5/5     Lower extremity   5/5 Normal tone throughout; no atrophy noted No pronator drift Sensory: Temp and light touch intact throughout, bilaterally without asymmetry. No extinction.  Deep Tendon Reflexes:  2+ bilateral brachioradialis, biceps, patellae and achilles Toes downgoing bilaterally Cerebellar: No ataxia with FNF or H-S bilaterally  Gait: Deferred  Results for orders placed or performed during the hospital encounter of 02/10/19 (from the past 48 hour(s))  Protime-INR     Status: None   Collection Time: 02/10/19  7:45 PM  Result Value Ref Range   Prothrombin Time 12.8 11.4 - 15.2 seconds   INR 0.97     Comment: Performed at [facility], [place] [address] [place], [place] [address]  APTT     Status: None   Collection Time: 02/10/19  7:45 PM  Result Value Ref Range   aPTT 26 24 - 36 seconds  Comment: Performed at [facility], [place] [address] [place] [address]  CBC     Status: None   Collection Time: 02/10/19  7:45 PM  Result Value Ref Range   WBC 9.0 4.0 - 10.5 K/uL   RBC 5.23 4.22 - 5.81 MIL/uL   Hemoglobin 16.0 13.0 - 17.0 g/dL   HCT 47.6 39.0 - 52.0 %   MCV 91.0 80.0 - 100.0 fL   MCH 30.6 26.0 - 34.0 pg   MCHC 33.6 30.0 - 36.0 g/dL   RDW 11.9 11.5 - 15.5 %   Platelets 258 150 - 400 K/uL   nRBC 0.0 0.0 - 0.2 %    Comment: Performed at [facility], [place] [address] [place], [place] [address]  Differential     Status: Abnormal    Collection Time: 02/10/19  7:45 PM  Result Value Ref Range   Neutrophils Relative % 45 %   Neutro Abs 4.2 1.7 - 7.7 K/uL   Lymphocytes Relative 39 %   Lymphs Abs 3.5 0.7 - 4.0 K/uL   Monocytes Relative 12 %   Monocytes Absolute 1.1 (H) 0.1 - 1.0 K/uL   Eosinophils Relative 2 %   Eosinophils Absolute 0.1 0.0 - 0.5 K/uL   Basophils Relative 1 %   Basophils Absolute 0.1 0.0 - 0.1 K/uL   Immature Granulocytes 1 %   Abs Immature Granulocytes 0.06 0.00 - 0.07 K/uL    Comment: Performed at [facility] [address] [place],  [address]   Ct Head Code Stroke Wo Contrast  Result Date: 02/10/2019 CLINICAL DATA:  Code stroke.  Dizziness left arm numbness EXAM: CT HEAD WITHOUT CONTRAST TECHNIQUE: Contiguous axial images were obtained from the base of the skull through the vertex without intravenous contrast. COMPARISON:  None. FINDINGS: Brain: No evidence of acute infarction, hemorrhage, hydrocephalus, extra-axial collection or mass lesion/mass effect. Vascular: Negative for hyperdense vessel Skull: Negative Sinuses/Orbits: Negative Other: None ASPECTS ([place] Stroke Program Early CT Score) - Ganglionic level infarction (caudate, lentiform nuclei, internal capsule, insula, M1-M3 cortex): 7 - Supraganglionic infarction (M4-M6 cortex): 3 Total score (0-10 with 10 being normal): 10 IMPRESSION: 1. Negative CT head 2. ASPECTS is 10 3. These results were called by telephone at the time of interpretation on 02/10/2019 at 7:55 pm to Dr. Cheral Marker , who verbally acknowledged these results. Electronically Signed   By: Franchot Gallo M.D.   On: 02/10/2019 19:56    Assessment: 53 y.o. male presenting with left sided paresthesias and chest pressure 1. Neurological exam nonfocal. NIHSS = 0 2. CT head normal 3. Stroke Risk Factors - HLD and HTN  Recommendations: 1. Chest pain work up per ED protocol 2. MRI, MRA of the brain without contrast 3. Cardiac telemetry 4. TTE 5. Carotid ultrasound 6.  Prophylactic therapy- Start ASA 81 mg po qd 7. Risk factor modification 8. PT consult, OT consult, Speech consult 9. Frequent neuro checks 10. Given that his presentation is more consistent with chest pain/cardiac etiology or anxiety than TIA, would hold off on permissive HTN and manage BP per standard protocols 11. HgbA1c, fasting lipid panel  @Electronically  signed: Dr. Kerney Elbe 02/10/2019, 8:29 PM

## 2019-02-11 ENCOUNTER — Encounter (HOSPITAL_COMMUNITY): Payer: Self-pay | Admitting: *Deleted

## 2019-02-11 ENCOUNTER — Observation Stay (HOSPITAL_BASED_OUTPATIENT_CLINIC_OR_DEPARTMENT_OTHER): Payer: BLUE CROSS/BLUE SHIELD

## 2019-02-11 DIAGNOSIS — G459 Transient cerebral ischemic attack, unspecified: Secondary | ICD-10-CM | POA: Diagnosis not present

## 2019-02-11 DIAGNOSIS — E785 Hyperlipidemia, unspecified: Secondary | ICD-10-CM | POA: Diagnosis not present

## 2019-02-11 DIAGNOSIS — R531 Weakness: Secondary | ICD-10-CM

## 2019-02-11 DIAGNOSIS — R0789 Other chest pain: Secondary | ICD-10-CM | POA: Diagnosis present

## 2019-02-11 DIAGNOSIS — R42 Dizziness and giddiness: Secondary | ICD-10-CM | POA: Diagnosis not present

## 2019-02-11 DIAGNOSIS — R200 Anesthesia of skin: Secondary | ICD-10-CM | POA: Diagnosis not present

## 2019-02-11 DIAGNOSIS — K219 Gastro-esophageal reflux disease without esophagitis: Secondary | ICD-10-CM | POA: Diagnosis not present

## 2019-02-11 LAB — ECHOCARDIOGRAM COMPLETE
Height: 66 in
Weight: 3037.06 oz

## 2019-02-11 LAB — MAGNESIUM: Magnesium: 2.3 mg/dL (ref 1.7–2.4)

## 2019-02-11 LAB — TSH: TSH: 1.406 u[IU]/mL (ref 0.350–4.500)

## 2019-02-11 LAB — HEMOGLOBIN A1C
Hgb A1c MFr Bld: 5.7 % — ABNORMAL HIGH (ref 4.8–5.6)
Mean Plasma Glucose: 116.89 mg/dL

## 2019-02-11 LAB — LIPID PANEL
Cholesterol: 198 mg/dL (ref 0–200)
HDL: 42 mg/dL (ref >40)
LDL Cholesterol: 133 mg/dL — ABNORMAL HIGH (ref 0–99)
Total CHOL/HDL Ratio: 4.7 RATIO
Triglycerides: 113 mg/dL (ref <150)
VLDL: 23 mg/dL (ref 0–40)

## 2019-02-11 LAB — HIV ANTIBODY (ROUTINE TESTING W REFLEX): HIV Screen 4th Generation wRfx: NONREACTIVE

## 2019-02-11 LAB — TROPONIN I
Troponin I: <0.03 ng/mL (ref <0.03)
Troponin I: <0.03 ng/mL (ref <0.03)

## 2019-02-11 MED ORDER — ASPIRIN EC 81 MG PO TBEC: 81.0000 mg | DELAYED_RELEASE_TABLET | Freq: Once | ORAL | Status: DC

## 2019-02-11 MED ORDER — STROKE: EARLY STAGES OF RECOVERY BOOK
Freq: Once | Status: AC
  Administered 2019-02-11: 04:00:00
  Filled 2019-02-11: qty 1

## 2019-02-11 MED ORDER — POTASSIUM CHLORIDE 10 MEQ/100ML IV SOLN
10.0000 meq | INTRAVENOUS | Status: DC
  Administered 2019-02-11: 10 meq via INTRAVENOUS
  Filled 2019-02-11: qty 100

## 2019-02-11 MED ORDER — LISINOPRIL-HYDROCHLOROTHIAZIDE 20-25 MG PO TABS: 1.0000 | ORAL_TABLET | Freq: Every day | ORAL | Status: DC

## 2019-02-11 MED ORDER — LISINOPRIL 20 MG PO TABS
20.0000 mg | ORAL_TABLET | Freq: Every day | ORAL | Status: DC
  Administered 2019-02-11: 20 mg via ORAL
  Filled 2019-02-11: qty 1

## 2019-02-11 MED ORDER — ASPIRIN 300 MG RE SUPP: 300.0000 mg | Freq: Every day | RECTAL | Status: DC

## 2019-02-11 MED ORDER — SODIUM CHLORIDE 0.9 % IV SOLN
INTRAVENOUS | Status: DC | PRN
  Administered 2019-02-11: 250 mL via INTRAVENOUS

## 2019-02-11 MED ORDER — PANTOPRAZOLE SODIUM 40 MG PO TBEC
40.0000 mg | DELAYED_RELEASE_TABLET | Freq: Every day | ORAL | Status: DC
  Administered 2019-02-11: 40 mg via ORAL
  Filled 2019-02-11: qty 1

## 2019-02-11 MED ORDER — HYDRALAZINE HCL 20 MG/ML IJ SOLN: 5.0000 mg | INTRAMUSCULAR | Status: DC | PRN

## 2019-02-11 MED ORDER — ACETAMINOPHEN 325 MG PO TABS: 650.0000 mg | ORAL_TABLET | ORAL | Status: DC | PRN

## 2019-02-11 MED ORDER — POTASSIUM CHLORIDE CRYS ER 20 MEQ PO TBCR
30.0000 meq | EXTENDED_RELEASE_TABLET | Freq: Once | ORAL | Status: AC
  Administered 2019-02-11: 30 meq via ORAL
  Filled 2019-02-11: qty 1

## 2019-02-11 MED ORDER — ACETAMINOPHEN 160 MG/5ML PO SOLN: 650.0000 mg | ORAL | Status: DC | PRN

## 2019-02-11 MED ORDER — SIMVASTATIN 20 MG PO TABS: 20.0000 mg | ORAL_TABLET | Freq: Every day | ORAL | Status: DC

## 2019-02-11 MED ORDER — SIMVASTATIN 20 MG PO TABS: 10.0000 mg | ORAL_TABLET | Freq: Every day | ORAL | Status: DC

## 2019-02-11 MED ORDER — AMLODIPINE BESYLATE 5 MG PO TABS: 5.0000 mg | ORAL_TABLET | Freq: Every day | ORAL | 0 refills | Status: AC

## 2019-02-11 MED ORDER — MORPHINE SULFATE (PF) 2 MG/ML IV SOLN: 2.0000 mg | INTRAVENOUS | Status: DC | PRN

## 2019-02-11 MED ORDER — ADULT MULTIVITAMIN W/MINERALS CH
1.0000 | ORAL_TABLET | Freq: Every day | ORAL | Status: DC
  Administered 2019-02-11: 1 via ORAL
  Filled 2019-02-11: qty 1

## 2019-02-11 MED ORDER — HYDROCHLOROTHIAZIDE 25 MG PO TABS
25.0000 mg | ORAL_TABLET | Freq: Every day | ORAL | Status: DC
  Administered 2019-02-11: 25 mg via ORAL
  Filled 2019-02-11: qty 1

## 2019-02-11 MED ORDER — ASPIRIN 325 MG PO TABS: 325.0000 mg | ORAL_TABLET | Freq: Every day | ORAL | Status: DC

## 2019-02-11 MED ORDER — ENOXAPARIN SODIUM 40 MG/0.4ML ~~LOC~~ SOLN
40.0000 mg | SUBCUTANEOUS | Status: DC
  Administered 2019-02-11: 40 mg via SUBCUTANEOUS
  Filled 2019-02-11: qty 0.4

## 2019-02-11 MED ORDER — ACETAMINOPHEN 650 MG RE SUPP: 650.0000 mg | RECTAL | Status: DC | PRN

## 2019-02-11 MED ORDER — NITROGLYCERIN 0.4 MG SL SUBL: 0.4000 mg | SUBLINGUAL_TABLET | SUBLINGUAL | Status: DC | PRN

## 2019-02-11 MED ORDER — ONDANSETRON HCL 4 MG/2ML IJ SOLN: 4.0000 mg | Freq: Three times a day (TID) | INTRAMUSCULAR | Status: DC | PRN

## 2019-02-11 MED ORDER — SENNOSIDES-DOCUSATE SODIUM 8.6-50 MG PO TABS: 1.0000 | ORAL_TABLET | Freq: Every evening | ORAL | Status: DC | PRN

## 2019-02-11 MED ORDER — VITAMIN D 25 MCG (1000 UNIT) PO TABS
1000.0000 [IU] | ORAL_TABLET | Freq: Every day | ORAL | Status: DC
  Administered 2019-02-11: 1000 [IU] via ORAL
  Filled 2019-02-11: qty 1

## 2019-02-11 NOTE — Progress Notes (Signed)
Carotid duplex       has been completed. Preliminary results can be found under CV proc through chart review. Keats Kingry, BS, RDMS, RVT   

## 2019-02-11 NOTE — Progress Notes (Signed)
Await echo and carotids

## 2019-02-11 NOTE — Progress Notes (Signed)
Occupational Therapy Evaluation Patient Details Name: Edgar Hall MRN: [id number] DOB: 1966-01-20 Today's Date: 02/11/2019    History of Present Illness Pt is a 53 y/o male admitted secondary to L sided numbness and weakness. MRI of brain was negative for any acute findings. PMH including but not limited to HTN and HLD.   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 Through discussion of life stressors, pt began talking about feelings of depression. Pt states he drinks daily and no longer does the things that "he enjoyed doing". "I don't have any motivation".  When asked if he felt depressed, the pt responded "yes" and became very emotional. States he has lost both parents and the  loss of his father has been very difficult. States he has been drinking more since his father passed away. Pt endorses that he has a family history of depression, attempted suicide as a "kid" and has an uncle who committed suicide. Pt himself states he is Not suicidal. Feel pt would benefit from outpt counseling. Contacted SW for resources and MD made aware. Pt very appreciative. OT signing off.   Pt states he is having more difficulty seeing out of his L eye. Encouraged pt to make an appointment with his eye doctor.   Follow Up Recommendations  Other (comment)(counseling as an outpt)    Equipment Recommendations  None recommended by OT    Recommendations for Other Services Other (comment)(social work for counseling)     Precautions / Restrictions Precautions Precautions: None Restrictions Weight Bearing Restrictions: No      Mobility Bed Mobility Overal bed mobility: Independent                Transfers Overall transfer level: Independent                    Balance Overall balance assessment: Independent                                   ADL either performed or assessed with clinical judgement   ADL Overall ADL's : At baseline                                              Vision   Additional Comments: complains of L eye "blurred vision"     Perception     Praxis      Pertinent Vitals/Pain Pain Assessment: No/denies pain     Hand Dominance Right   Extremity/Trunk Assessment Upper Extremity Assessment Upper Extremity Assessment: Overall WFL for tasks assessed   Lower Extremity Assessment Lower Extremity Assessment: Overall WFL for tasks assessed   Cervical / Trunk Assessment Cervical / Trunk Assessment: Normal   Communication Communication Communication: No difficulties   Cognition Arousal/Alertness: Awake/alert Behavior During Therapy: WFL for tasks assessed/performed; signs/synmptoms of depression Overall Cognitive Status: Within Functional Limits for tasks assessed                                     General Comments       Exercises     Shoulder Instructions      Home Living Family/patient expects to be discharged to:: Private residence Living Arrangements: Spouse/significant other Available Help  at Discharge: Family;Available 24 hours/day Type of Home: House Home Access: Stairs to enter [employer] of Steps: 2   Home Layout: One level     Bathroom Shower/Tub: Tub/shower unit         Home Equipment: None          Prior Functioning/Environment Level of Independence: Independent        Comments: 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[employer], research (medical) Problem List: Other (comment)("feeling depressed")      OT Treatment/Interventions:      OT Goals(Current goals can be found in the care plan section) Acute Rehab OT Goals Patient Stated Goal: to get motivated to do stuff again OT Goal Formulation: All assessment and education complete, DC therapy  OT Frequency:     Barriers to D/C:            Co-evaluation              AM-PAC OT "6 Clicks" Daily Activity     Outcome Measure Help from another person  eating meals?: None Help from another person taking care of personal grooming?: None Help from another person toileting, which includes using toliet, bedpan, or urinal?: None Help from another person bathing (including washing, rinsing, drying)?: None Help from another person to put on and taking off regular upper body clothing?: None Help from another person to put on and taking off regular lower body clothing?: None 6 Click Score: 24   End of Session    Activity Tolerance: Patient tolerated treatment well Patient left: in bed;with call bell/phone within reach  OT Visit Diagnosis: Other (comment)(L sided numbness)                Time: 4383-8184 OT Time Calculation (min): 15 min Charges:  OT General Charges $OT Visit: 1 Visit OT Evaluation $OT Eval Low Complexity: [place], OT/L   Acute OT Clinical Specialist Acute Rehabilitation Services Pager [phone] Office [phone]   [facility] 02/11/2019, 1:04 PM

## 2019-02-11 NOTE — Progress Notes (Signed)
Patient arrived to floor complaining of pain at the IV site in the left [facility] placed by EMS. Patient had scheduled potassium, so a new IV site was placed before starting the potassium. Potassium was started and the patient complained of burning, RN diluted the potassium and the patient still complained. Pt stated he could not handle the potassium. RN contacted on call, potassium will be switched to PO.

## 2019-02-11 NOTE — Evaluation (Signed)
Physical Therapy Evaluation & Discharge Patient Details Name: Edgar Hall MRN: [id number] DOB: 05/21/1966 Today's Date: 02/11/2019   History of Present Illness  Pt is a 53 y/o male admitted secondary to L sided numbness and weakness. MRI of brain was negative for any acute findings. PMH including but not limited to HTN and HLD.  Clinical Impression  Pt presented supine in bed with HOB elevated, awake and willing to participate in therapy session. Prior to admission, pt reported that he was independent with all functional mobility and ADLs. Pt lives with his wife in a single level house with two steps to enter. He is currently independent with all functional mobility including stair negotiation. Pt participated in a higher level balance assessment and scored a 24/24 on the DGI, indicating that he is a safe community ambulator. Pt reported that he feels he is back to his baseline in regards to functional mobility. No further acute PT needs identified at this time. PT signing off.     Follow Up Recommendations No PT follow up    Equipment Recommendations  None recommended by PT    Recommendations for Other Services       Precautions / Restrictions Precautions Precautions: None Restrictions Weight Bearing Restrictions: No      Mobility  Bed Mobility Overal bed mobility: Independent                Transfers Overall transfer level: Independent                  Ambulation/Gait Ambulation/Gait assistance: Independent Gait Distance (Feet): 300 Feet Assistive device: None Gait Pattern/deviations: WFL(Within Functional Limits) Gait velocity: WFL   General Gait Details: no abnormalities noted  Stairs Stairs: Yes Stairs assistance: Independent Stair Management: No rails;Alternating pattern;Forwards Number of Stairs: 2    Wheelchair Mobility    Modified Rankin (Stroke Patients Only)       Balance Overall balance assessment: Independent                                Standardized Balance Assessment Standardized Balance Assessment : Dynamic Gait Index   Dynamic Gait Index Level Surface: Normal Change in Gait Speed: Normal Gait with Horizontal Head Turns: Normal Gait with Vertical Head Turns: Normal Gait and Pivot Turn: Normal Step Over Obstacle: Normal Step Around Obstacles: Normal Steps: Normal Total Score: 24       Pertinent Vitals/Pain Pain Assessment: No/denies pain    Home Living Family/patient expects to be discharged to:: Private residence Living Arrangements: Spouse/significant other Available Help at Discharge: Family;Available 24 hours/day Type of Home: House Home Access: Stairs to enter   [employer] of Steps: 2 Home Layout: One level Home Equipment: None      Prior Function Level of Independence: Independent         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   Dominant Hand: Right    Extremity/Trunk Assessment   Upper Extremity Assessment Upper Extremity Assessment: Overall WFL for tasks assessed    Lower Extremity Assessment Lower Extremity Assessment: Overall WFL for tasks assessed    Cervical / Trunk Assessment Cervical / Trunk Assessment: Normal  Communication   Communication: No difficulties  Cognition Arousal/Alertness: Awake/alert Behavior During Therapy: WFL for tasks assessed/performed Overall Cognitive Status: Within Functional Limits for tasks assessed  General Comments      Exercises     Assessment/Plan    PT Assessment Patent does not need any further PT services  PT Problem List         PT Treatment Interventions      PT Goals (Current goals can be found in the Care Plan section)  Acute Rehab PT Goals Patient Stated Goal: "go home soon" PT Goal Formulation: All assessment and education complete, DC therapy    Frequency     Barriers to discharge         Co-evaluation               AM-PAC PT "6 Clicks" Mobility  Outcome Measure Help needed turning from your back to your side while in a flat bed without using bedrails?: None Help needed moving from lying on your back to sitting on the side of a flat bed without using bedrails?: None Help needed moving to and from a bed to a chair (including a wheelchair)?: None Help needed standing up from a chair using your arms (e.g., wheelchair or bedside chair)?: None Help needed to walk in hospital room?: None Help needed climbing 3-5 steps with a railing? : None 6 Click Score: 24    End of Session   Activity Tolerance: Patient tolerated treatment well Patient left: in bed;with call bell/phone within reach Nurse Communication: Mobility status PT Visit Diagnosis: Other symptoms and signs involving the nervous system (R29.898)    Time: 3710-6269 PT Time Calculation (min) (ACUTE ONLY): 17 min   Charges:   PT Evaluation $PT Eval Moderate Complexity: 1 Mod          Sherie Don, PT, DPT  Acute Rehabilitation Services Pager [phone] Office [place] 02/11/2019, 11:13 AM

## 2019-02-11 NOTE — Progress Notes (Signed)
STROKE TEAM PROGRESS NOTE   SUBJECTIVE (INTERVAL HISTORY) His RN and OT are at the bedside.  Patient sitting in bed, stated that his symptoms all resolved.  He was scared yesterday due to lightheadedness, feeling of passing out.  However his blood pressure was high on arrival to ER.  He admitted depressed mood to OT after my visit.  OBJECTIVE Vitals:   02/11/19 0400 02/11/19 0600 02/11/19 0822 02/11/19 1151  BP: (!) 119/92 115/80 (!) 142/87 (!) 132/94  Pulse: 64 64 86 81  Resp: 16 16 17 16   Temp: 98.1 F (36.7 C) 97.8 F (36.6 C) 98.4 F (36.9 C) (!) 97.3 F (36.3 C)  TempSrc: Oral Oral Oral Oral  SpO2: 98%  98% 100%  Weight:      Height:        CBC:  Recent Labs  Lab 02/10/19 1945  WBC 9.0  NEUTROABS 4.2  HGB 16.0  HCT 47.6  MCV 91.0  PLT 914    Basic Metabolic Panel:  Recent Labs  Lab 02/10/19 1945 02/11/19 0308  NA 134*  --   K 3.1*  --   CL 99  --   CO2 23  --   GLUCOSE 105*  --   BUN 12  --   CREATININE 1.11  --   CALCIUM 9.2  --   MG  --  2.3    Lipid Panel:     Component Value Date/Time   CHOL 198 02/11/2019 0308   TRIG 113 02/11/2019 0308   HDL 42 02/11/2019 0308   CHOLHDL 4.7 02/11/2019 0308   VLDL 23 02/11/2019 0308   LDLCALC 133 (H) 02/11/2019 0308   HgbA1c:  Lab Results  Component Value Date   HGBA1C 5.7 (H) 02/11/2019   Urine Drug Screen:     Component Value Date/Time   LABOPIA NONE DETECTED 02/10/2019 2240   COCAINSCRNUR NONE DETECTED 02/10/2019 2240   LABBENZ NONE DETECTED 02/10/2019 2240   AMPHETMU NONE DETECTED 02/10/2019 2240   THCU NONE DETECTED 02/10/2019 2240   LABBARB NONE DETECTED 02/10/2019 2240    Alcohol Level     Component Value Date/Time   [facility] <10 02/10/2019 1945    IMAGING   Mr Jodene Nam Head Wo Contrast 02/11/2019 IMPRESSION:  1. No acute intracranial abnormality. Unremarkable MRI of the brain.  2. Mild motion degradation of MRA of the head. No intracranial large vessel occlusion, aneurysm, or high-grade  stenosis.    Ct Head Code Stroke Wo Contrast 02/10/2019 IMPRESSION:  1. Negative CT head  2. ASPECTS is 10    Transthoracic Echocardiogram   1. The left ventricle has normal systolic function with an ejection fraction of 60-65%. The cavity size was normal. There is mildly increased left ventricular wall thickness. Left ventricular diastolic Doppler parameters are consistent with impaired  relaxation No evidence of left ventricular regional wall motion abnormalities.  2. The right ventricle has normal systolic function. The cavity was normal. There is no increase in right ventricular wall thickness. Right ventricular systolic pressure could not be assessed.  3. The mitral valve is normal in structure.  4. The tricuspid valve is normal in structure.  5. The aortic valve is tricuspid. Mild calcification of the aortic valve.  6. The aortic root is normal in size and structure.  7. No obvious atrial level shunt detected by color flow Doppler.   Bilateral Carotid Dopplers  00/00/2020 Pending    PHYSICAL EXAM  Temp:  [97.3 F (36.3 C)-98.4 F (36.9 C)] 97.3  F (36.3 C) (02/22 1151) Pulse Rate:  [61-86] 81 (02/22 1151) Resp:  [11-18] 16 (02/22 1151) BP: (101-142)/(80-96) 132/94 (02/22 1151) SpO2:  [95 %-100 %] 100 % (02/22 1151) Weight:  [86.1 kg] 86.1 kg (02/22 0336)  General - Well nourished, well developed, in no apparent distress.  Ophthalmologic - fundi not visualized due to noncooperation.  Cardiovascular - Regular rate and rhythm.  Mental Status -  Level of arousal and orientation to time, place, and person were intact. Language including expression, naming, repetition, comprehension was assessed and found intact. Attention span and concentration were normal. Fund of Knowledge was assessed and was intact.  Cranial Nerves II - XII - II - Visual field intact OU. III, IV, VI - Extraocular movements intact. V - Facial sensation intact bilaterally. VII - Facial  movement intact bilaterally. VIII - Hearing & vestibular intact bilaterally. X - Palate elevates symmetrically. XI - Chin turning & shoulder shrug intact bilaterally. XII - Tongue protrusion intact.  Motor Strength - The patient's strength was normal in all extremities and pronator drift was absent.  Bulk was normal and fasciculations were absent.   Motor Tone - Muscle tone was assessed at the neck and appendages and was normal.  Reflexes - The patient's reflexes were symmetrical in all extremities and he had no pathological reflexes.  Sensory - Light touch, temperature/pinprick were assessed and were symmetrical.    Coordination - The patient had normal movements in the hands and feet with no ataxia or dysmetria.  Tremor was absent.  Gait and Station - deferred.    ASSESSMENT/PLAN Edgar Hall is a 53 y.o. male with history of Htn, Hld, and Edgar Hall presenting with paresthesias, pre syncope, nausea, chest pain and elevated BP. He did not receive IV t-PA due to NIHSS of 0.  Anxiety or panic attack vs. Hypertensive encephalopathy    Resultant back to baseline  CT head - negative  MRI head - negative  MRA head - negative  Carotid Doppler - pending  2D Echo EF 60 to 65%  LDL - 133  HgbA1c - 5.7  UDS - negative  VTE prophylaxis - Lovenox  Diet  - Heart healthy with thin liquids.  aspirin 81 mg daily prior to admission, now on aspirin 81 mg daily.  Continue aspirin on discharge  Patient counseled to be compliant with his antithrombotic medications  Ongoing aggressive stroke risk factor management  Therapy recommendations:  No f/u PT recommended  Disposition:  Pending  Hypertension  Stable . Long-term BP goal normotensive  Hyperlipidemia  Lipid lowering medication PTA: Zocor 10 mg daily  LDL 133, goal < 70  Current lipid lowering medication: none due to elevated ALT/AST  Alcohol use  Previously that he is a daily drinker, 2 beers per day  Later  he admitted that he recently drink more due to depressed mood  Recommend limit alcohol use  AST/ALT elevated likely due to alcohol use  Depressed mood  Family history of depression  Denies SI or HI  Will need outpatient counseling  Follow with PCP  Other Stroke Risk Factors  Former cigarette smoker - quit  Obesity, Body mass index is 30.64 kg/m., recommend weight loss, diet and exercise as appropriate   Other Active Problems  Hypokalemia 3.1 - supplemented   Hospital day # 0  Neurology will sign off. Please call with questions.  No neuro follow-up needed at this time. Thanks for the consult.  Rosalin Hawking, MD PhD Stroke Neurology 02/11/2019 2:52 PM  To contact Stroke Continuity provider, please refer to [email or web address] After hours, contact General Neurology

## 2019-02-11 NOTE — Progress Notes (Signed)
CSW was referred to speak with the patient by the OT and Dr. Eliseo Squires.   Patient was alert and oriented. Wife was present at bedside. Patient disclosed that he has been struggling with depression the last few years. He does not have any thoughts of harming himself or others around him. He described having these emotions ever since loosing his parents. He is not experiencing any lose of sleep. Has described loosing interest in daily activities or having days where it is hard to get up.   Patient is open to going to speak with someone and receive counseling services. CSW provided a list of outpatient resources. CSW stated that if the patient had any other concerns to have the nurse page again.   CSW signing off.   Domenic Schwab, MSW, [place]

## 2019-02-11 NOTE — Progress Notes (Signed)
  Echocardiogram 2D Echocardiogram has been performed.  Darlina Sicilian M 02/11/2019, 1:37 PM

## 2019-02-11 NOTE — Discharge Summary (Signed)
Physician Discharge Summary  ROGERIO BOUTELLE NUU:[id number] DOB: 11-05-1966 DOA: 02/10/2019  PCP: [place] date: 02/10/2019 Discharge date: 02/11/2019  Admitted From: home Discharge disposition: home   Recommendations for Outpatient Follow-Up:   Outpatient counseling LFTs  Discharge Diagnosis:   Principal Problem:   Left sided numbness Active Problems:   GERD (gastroesophageal reflux disease)   Hyperlipidemia   Hypertension   Abnormal LFTs   Chest pressure   Left-sided weakness    Discharge Condition: Improved.  Diet recommendation: Low sodium, heart healthy  Wound care: None.  Code status: Full.   History of Present Illness:   Edgar Hall is a 53 y.o. male with medical history significant of hypertension, hyperlipidemia, GERD, ventral hernia who presents with left-sided numbness and weakness.  Patient states that his symptoms started at about 3:30-4:00 this afternoon, including left-sided weakness, numbness and tingling.  Symptoms lasted for about 3 hours, then gradually resolved. Per wife, patient was in his truck and started feeling "swimmy-headed", feels like in an unsteady on a boat. He felt nauseated, and had one episode of chest pressure which has resolved. He did not pass out. He also has right frontal headache.  No vision loss or hearing loss.  No facial droop, slurred speech or difficulty swallowing.  Patient denies chest pain, cough, fever or chills.  No abdominal pain, vomiting or diarrhea.  Patient states that she drinks 2 beers daily. Per EMS, pt had elevated blood pressure 220/130,    Hospital Course by Problem:   Anxiety or panic attack vs. Hypertensive encephalopathy    Resultant back to baseline  CT head - negative  MRI head - negative  MRA head - negative  Carotid Doppler -normal  2D Echo EF 60 to 65%  LDL - 133  HgbA1c - 5.7  Per neuro: aspirin 81 mg daily prior to admission, now on aspirin 81  mg daily.  Continue aspirin on discharge  Hypertension  Stable  Long-term BP goal normotensive  Hyperlipidemia  LDL 133, goal < 70  Current lipid lowering medication: none due to elevated ALT/AST- once improved, restart statin  Alcohol use  Previously that he is a daily drinker, 2 beers per day  Later he admitted that he recently drink more due to depressed mood  Recommend limit alcohol use  AST/ALT elevated likely due to alcohol use  Depressed mood  Family history of depression  Denies SI or HI  Will need outpatient counseling- resources given by social work     Medical Consultants:   neuro   Discharge Exam:   Vitals:   02/11/19 1151 02/11/19 1557  BP: (!) 132/94 (!) 128/99  Pulse: 81 72  Resp: 16 16  Temp: (!) 97.3 F (36.3 C) 98.1 F (36.7 C)  SpO2: 100% 100%   Vitals:   02/11/19 0600 02/11/19 0822 02/11/19 1151 02/11/19 1557  BP: 115/80 (!) 142/87 (!) 132/94 (!) 128/99  Pulse: 64 86 81 72  Resp: 16 17 16 16   Temp: 97.8 F (36.6 C) 98.4 F (36.9 C) (!) 97.3 F (36.3 C) 98.1 F (36.7 C)  TempSrc: Oral Oral Oral Oral  SpO2:  98% 100% 100%  Weight:      Height:        General exam: Appears calm and comfortable.    The results of significant diagnostics from this hospitalization (including imaging, microbiology, ancillary and laboratory) are listed below for reference.     Procedures and Diagnostic Studies:  Mr Virgel [place] Contrast  Result Date: 02/11/2019 CLINICAL DATA:  53 y/o M; numbness and tingling of left upper and lower extremities. EXAM: MRI HEAD WITHOUT CONTRAST MRA HEAD WITHOUT CONTRAST TECHNIQUE: Multiplanar, multiecho pulse sequences of the brain and surrounding structures were obtained without intravenous contrast. Angiographic images of the head were obtained using MRA technique without contrast. COMPARISON:  02/10/2019 CT head FINDINGS: MRI HEAD FINDINGS Brain: No acute infarction, hemorrhage, hydrocephalus, extra-axial  collection or mass lesion. No structural or signal abnormality of the brain. Vascular: Normal flow voids. Skull and upper cervical spine: Normal marrow signal. Sinuses/Orbits: Tiny right maxillary sinus mucous retention cyst. Otherwise visible paranasal sinuses and the mastoid air cells demonstrate normal signal. Orbits are unremarkable. Other: None. MRA HEAD FINDINGS Mild motion artifact. Internal carotid arteries:  Patent. Anterior cerebral arteries:  Patent. Middle cerebral arteries: Patent. Anterior communicating artery: Patent. Posterior communicating arteries:  Right fetal PCA. Posterior cerebral arteries:  Patent. Basilar artery:  Patent. Vertebral arteries:  Patent. No high-grade stenosis, large vessel occlusion, or aneurysm identified. IMPRESSION: 1. No acute intracranial abnormality. Unremarkable MRI of the brain. 2. Mild motion degradation of MRA of the head. No intracranial large vessel occlusion, aneurysm, or high-grade stenosis. Electronically Signed   By: Kristine Garbe M.D.   On: 02/11/2019 00:00   Mr Brain Wo Contrast  Result Date: 02/11/2019 CLINICAL DATA:  53 y/o M; numbness and tingling of left upper and lower extremities. EXAM: MRI HEAD WITHOUT CONTRAST MRA HEAD WITHOUT CONTRAST TECHNIQUE: Multiplanar, multiecho pulse sequences of the brain and surrounding structures were obtained without intravenous contrast. Angiographic images of the head were obtained using MRA technique without contrast. COMPARISON:  02/10/2019 CT head FINDINGS: MRI HEAD FINDINGS Brain: No acute infarction, hemorrhage, hydrocephalus, extra-axial collection or mass lesion. No structural or signal abnormality of the brain. Vascular: Normal flow voids. Skull and upper cervical spine: Normal marrow signal. Sinuses/Orbits: Tiny right maxillary sinus mucous retention cyst. Otherwise visible paranasal sinuses and the mastoid air cells demonstrate normal signal. Orbits are unremarkable. Other: None. MRA HEAD FINDINGS  Mild motion artifact. Internal carotid arteries:  Patent. Anterior cerebral arteries:  Patent. Middle cerebral arteries: Patent. Anterior communicating artery: Patent. Posterior communicating arteries:  Right fetal PCA. Posterior cerebral arteries:  Patent. Basilar artery:  Patent. Vertebral arteries:  Patent. No high-grade stenosis, large vessel occlusion, or aneurysm identified. IMPRESSION: 1. No acute intracranial abnormality. Unremarkable MRI of the brain. 2. Mild motion degradation of MRA of the head. No intracranial large vessel occlusion, aneurysm, or high-grade stenosis. Electronically Signed   By: Kristine Garbe M.D.   On: 02/11/2019 00:00   Ct Head Code Stroke Wo Contrast  Result Date: 02/10/2019 CLINICAL DATA:  Code stroke.  Dizziness left arm numbness EXAM: CT HEAD WITHOUT CONTRAST TECHNIQUE: Contiguous axial images were obtained from the base of the skull through the vertex without intravenous contrast. COMPARISON:  None. FINDINGS: Brain: No evidence of acute infarction, hemorrhage, hydrocephalus, extra-axial collection or mass lesion/mass effect. Vascular: Negative for hyperdense vessel Skull: Negative Sinuses/Orbits: Negative Other: None ASPECTS (Gantt Stroke Program Early CT Score) - Ganglionic level infarction (caudate, lentiform nuclei, internal capsule, insula, M1-M3 cortex): 7 - Supraganglionic infarction (M4-M6 cortex): 3 Total score (0-10 with 10 being normal): 10 IMPRESSION: 1. Negative CT head 2. ASPECTS is 10 3. These results were called by telephone at the time of interpretation on 02/10/2019 at 7:55 pm to Dr. Cheral Marker , who verbally acknowledged these results. Electronically Signed   By: Franchot Gallo M.D.  On: 02/10/2019 19:56     Labs:   Basic Metabolic Panel: Recent Labs  Lab 02/10/19 1945 02/11/19 0308  NA 134*  --   K 3.1*  --   CL 99  --   CO2 23  --   GLUCOSE 105*  --   BUN 12  --   CREATININE 1.11  --   CALCIUM 9.2  --   MG  --  2.3    GFR Estimated Creatinine Clearance: 81.9 mL/min (by C-G formula based on SCr of 1.11 mg/dL). Liver Function Tests: Recent Labs  Lab 02/10/19 1945  AST 60*  ALT 151*  ALKPHOS 43  BILITOT 0.7  PROT 7.0  ALBUMIN 3.9   No results for input(s): LIPASE, AMYLASE in the last 168 hours. No results for input(s): AMMONIA in the last 168 hours. Coagulation profile Recent Labs  Lab 02/10/19 1945  INR 0.97    CBC: Recent Labs  Lab 02/10/19 1945  WBC 9.0  NEUTROABS 4.2  HGB 16.0  HCT 47.6  MCV 91.0  PLT 258   Cardiac Enzymes: Recent Labs  Lab 02/10/19 2100 02/11/19 0308 02/11/19 0852  TROPONINI <0.03 <0.03 <0.03   BNP: Invalid input(s): POCBNP CBG: No results for input(s): GLUCAP in the last 168 hours. D-Dimer No results for input(s): DDIMER in the last 72 hours. Hgb A1c Recent Labs    02/11/19 0308  HGBA1C 5.7*   Lipid Profile Recent Labs    02/11/19 0308  CHOL 198  HDL 42  LDLCALC 133*  TRIG 113  CHOLHDL 4.7   Thyroid function studies Recent Labs    02/11/19 1150  TSH 1.406   Anemia work up No results for input(s): VITAMINB12, FOLATE, FERRITIN, TIBC, IRON, RETICCTPCT in the last 72 hours. Microbiology No results found for this or any previous visit (from the past 240 hour(s)).   Discharge Instructions:   Discharge Instructions    Diet - low sodium heart healthy   Complete by:  As directed    Discharge instructions   Complete by:  As directed    Cut back on alcohol Outpatient counseling referral Holding statin (zocor) until liver enzymes normalize   Increase activity slowly   Complete by:  As directed      Allergies as of 02/11/2019   No Known Allergies     Medication List    STOP taking these medications   simvastatin 10 MG tablet Commonly known as:  ZOCOR     TAKE these medications   amLODipine 5 MG tablet Commonly known as:  NORVASC Take 1 tablet (5 mg total) by mouth daily.   aspirin EC 81 MG tablet Take 81 mg by mouth  daily.   fluticasone 50 MCG/ACT nasal spray Commonly known as:  FLONASE Place 1 spray into both nostrils daily as needed for allergies or rhinitis.   lisinopril-hydrochlorothiazide 20-25 MG tablet Commonly known as:  PRINZIDE,ZESTORETIC Take 1 tablet by mouth daily.   multivitamin tablet Take 1 tablet by mouth daily.   omeprazole 40 MG capsule Commonly known as:  PRILOSEC Take 40 mg by mouth daily.   VITAMIN D PO Take 1 tablet by mouth daily.      Follow-up [place]. Follow up in 1 week(s).   Contact information: [place] [phone]            Time coordinating discharge: 25 min  Signed:  Geradine Girt DO  [facility] 02/12/2019, 3:23  PM

## 2019-02-13 LAB — HEPATITIS PANEL, ACUTE
HCV Ab: <0.1 s/co ratio (ref 0.0–0.9)
Hep A IgM: NEGATIVE
Hep B C IgM: NEGATIVE
Hepatitis B Surface Ag: NEGATIVE

## 2020-03-01 ENCOUNTER — Ambulatory Visit: Payer: BC Managed Care – PPO | Attending: Internal Medicine

## 2020-03-01 ENCOUNTER — Ambulatory Visit: Payer: BC Managed Care – PPO

## 2020-03-01 DIAGNOSIS — Z23 Encounter for immunization: Secondary | ICD-10-CM

## 2020-03-01 NOTE — Progress Notes (Signed)
   Covid-19 Vaccination Clinic  Name:  Edgar Hall    MRN: [id number] DOB: 05-24-1966  03/01/2020  [name] was observed post Covid-19 immunization for 15 minutes without incident. He was provided with Vaccine Information Sheet and instruction to access the V-Safe system.   [name] was instructed to call 911 with any severe reactions post vaccine: Marland Kitchen Difficulty breathing  . Swelling of face and throat  . A fast heartbeat  . A bad rash all over body  . Dizziness and weakness   Immunizations Administered    Name Date Dose VIS Date Route   Pfizer COVID-19 Vaccine 03/01/2020 10:30 AM 0.3 mL 12/01/2019 Intramuscular   Manufacturer: [place]   Lot: [id number]   [place]: [id number]

## 2020-03-27 ENCOUNTER — Ambulatory Visit: Payer: BC Managed Care – PPO | Attending: Internal Medicine

## 2020-03-27 DIAGNOSIS — Z23 Encounter for immunization: Secondary | ICD-10-CM

## 2020-03-27 NOTE — Progress Notes (Signed)
   Covid-19 Vaccination Clinic  Name:  Edgar Hall    MRN: [id number] DOB: 1966-05-06  03/27/2020  [name] was observed post Covid-19 immunization for 15 minutes without incident. He was provided with Vaccine Information Sheet and instruction to access the V-Safe system.   [name] was instructed to call 911 with any severe reactions post vaccine: Marland Kitchen Difficulty breathing  . Swelling of face and throat  . A fast heartbeat  . A bad rash all over body  . Dizziness and weakness   Immunizations Administered    Name Date Dose VIS Date Route   Pfizer COVID-19 Vaccine 03/27/2020 10:08 AM 0.3 mL 12/01/2019 Intramuscular   Manufacturer: [employer]   Lot: [id number]   [place]: [id number]

## 2020-11-23 ENCOUNTER — Other Ambulatory Visit: Payer: Self-pay

## 2020-11-23 ENCOUNTER — Ambulatory Visit: Payer: Self-pay

## 2020-11-23 ENCOUNTER — Ambulatory Visit: Payer: BC Managed Care – PPO | Attending: Internal Medicine

## 2020-11-23 DIAGNOSIS — Z23 Encounter for immunization: Secondary | ICD-10-CM

## 2020-11-23 NOTE — Progress Notes (Signed)
   Covid-19 Vaccination Clinic  Name:  Edgar Hall    MRN: [id number] DOB: July 10, 1966  11/23/2020  [name] was observed post Covid-19 immunization for 15 minutes without incident. He was provided with Vaccine Information Sheet and instruction to access the V-Safe system.   [name] was instructed to call 911 with any severe reactions post vaccine: Marland Kitchen Difficulty breathing  . Swelling of face and throat  . A fast heartbeat  . A bad rash all over body  . Dizziness and weakness   Immunizations Administered    Name Date Dose VIS Date Route   Pfizer COVID-19 Vaccine 11/23/2020 11:44 AM 0.3 mL 10/09/2020 Intramuscular   Manufacturer: [place]   Lot: [id number]   [facility]: [address]-0

## 2021-02-19 IMAGING — MR MR MRA HEAD W/O CM
10 of 12 series · 32 of 48 positions shown · non-contrast
Comparison: 02/10/2019 CT head

CLINICAL DATA: 52 y/o M; numbness and tingling of left upper and
lower extremities.

EXAM:
MRI HEAD WITHOUT CONTRAST
MRA HEAD WITHOUT CONTRAST
TECHNIQUE: Multiplanar, multiecho pulse sequences of the brain and surrounding
structures were obtained without intravenous contrast. Angiographic
images of the head were obtained using MRA technique without
contrast.

[Series 5: DWI · axial · 3.0mm · 0.88mm/px · z∈[-82,+60]mm · 6 of 100 slices shown (1 of 4)]
[im 1/100]
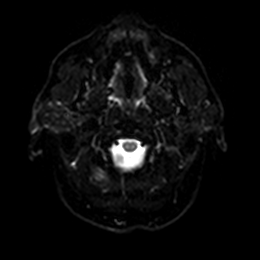
[im 20/100]
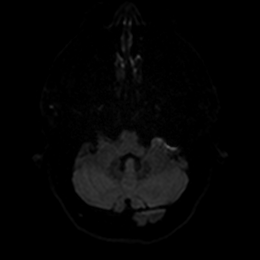
[im 40/100]
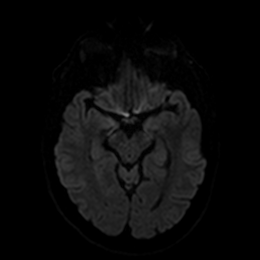
[im 60/100]
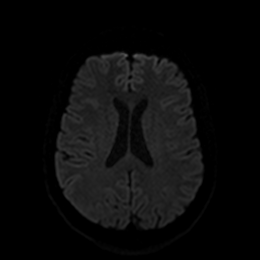
[im 80/100]
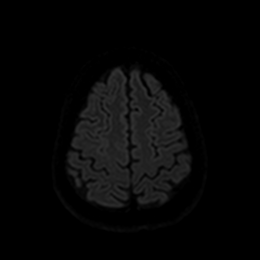
[im 100/100]
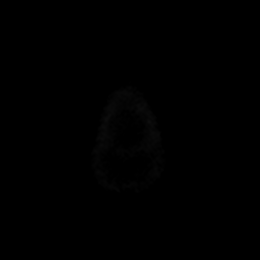

[Series 6: DWI · axial · 3.0mm · 0.88mm/px · z∈[-82,+60]mm · 3 of 50 slices shown (2 of 4)]
[im 1/50]
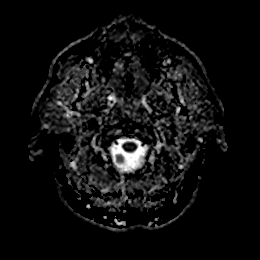
[im 25/50]
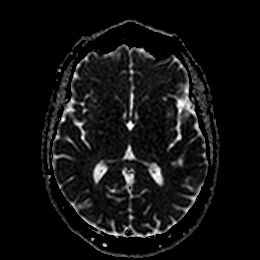
[im 50/50]
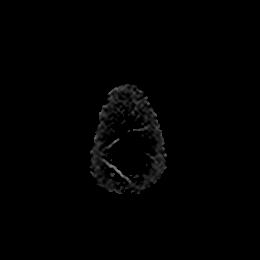

[Series 7: DWI · coronal · 4.0mm · 0.88mm/px · 5 of 72 slices shown (3 of 4)]
[im 1/72]
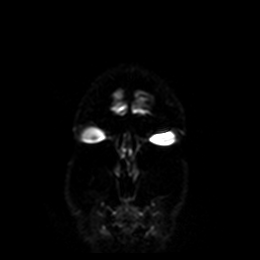
[im 18/72]
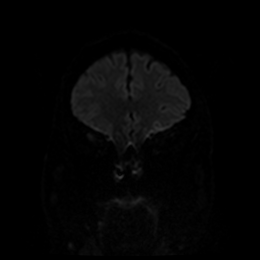
[im 36/72]
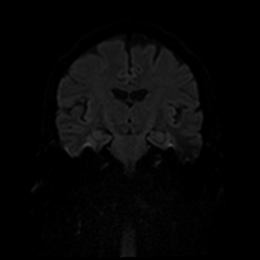
[im 54/72]
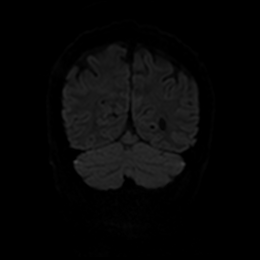
[im 72/72]
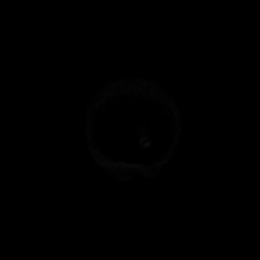

[Series 8: DWI · coronal · 4.0mm · 0.88mm/px · 2 of 36 slices shown (4 of 4)]
[im 1/36]
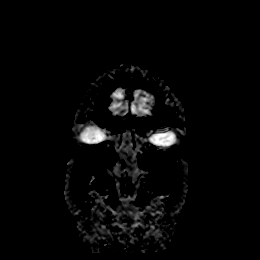
[im 36/36]
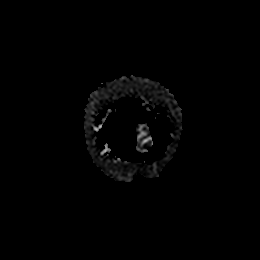

[Series 9: T1 · sagittal · 5.0mm · 0.75mm/px · 2 of 23 slices shown]
[im 1/23]
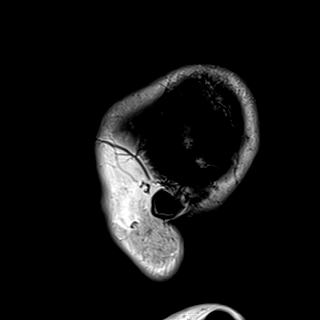
[im 23/23]
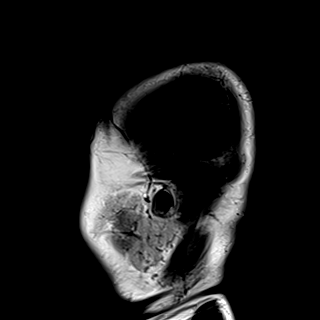

[Series 10: T2 · axial · 5.0mm · 0.72mm/px · z∈[-82,+63]mm · 2 of 26 slices shown (1 of 2)]
[im 1/26]
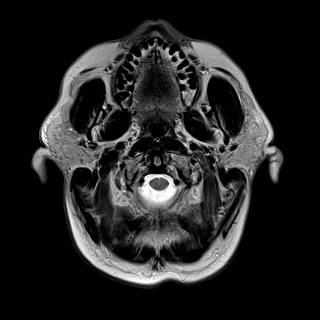
[im 26/26]
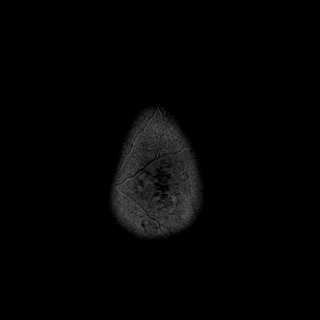

[Series 11: FLAIR · axial · 5.0mm · 0.45mm/px · z∈[-83,+62]mm · 2 of 26 slices shown]
[im 1/26]
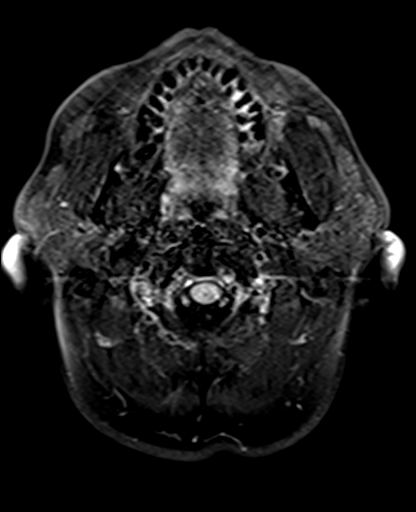
[im 26/26]
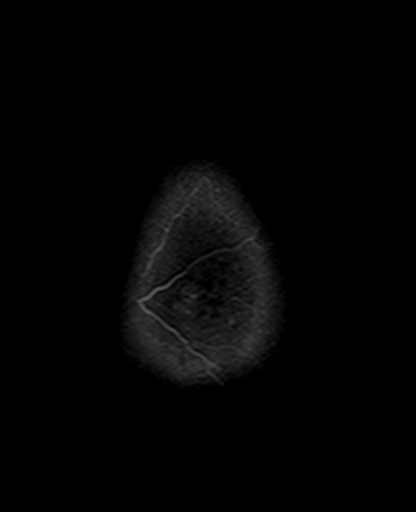

[Series 13: pha_images · axial · 3.0mm · 0.90mm/px · z∈[-93,+79]mm · 4 of 56 slices shown]
[im 1/56]
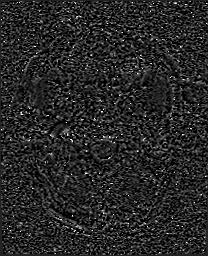
[im 19/56]
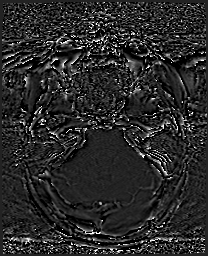
[im 37/56]
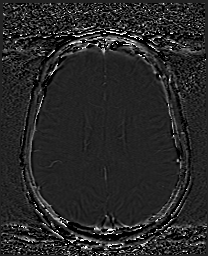
[im 56/56]
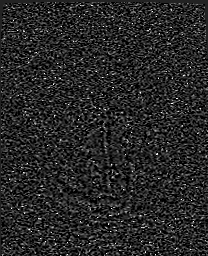

[Series 14: swi_images · axial · 3.0mm · 0.90mm/px · z∈[-93,+79]mm · 4 of 60 slices shown]
[im 1/60]
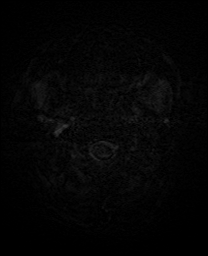
[im 20/60]
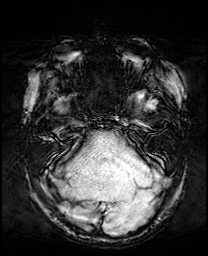
[im 40/60]
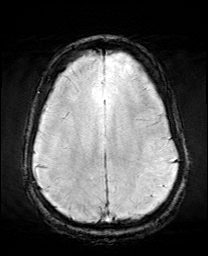
[im 60/60]
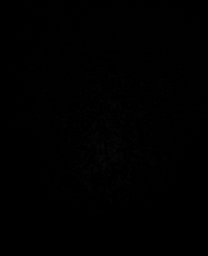

[Series 21: T2 · coronal · 5.0mm · 0.34mm/px · 2 of 31 slices shown (2 of 2)]
[im 1/31]
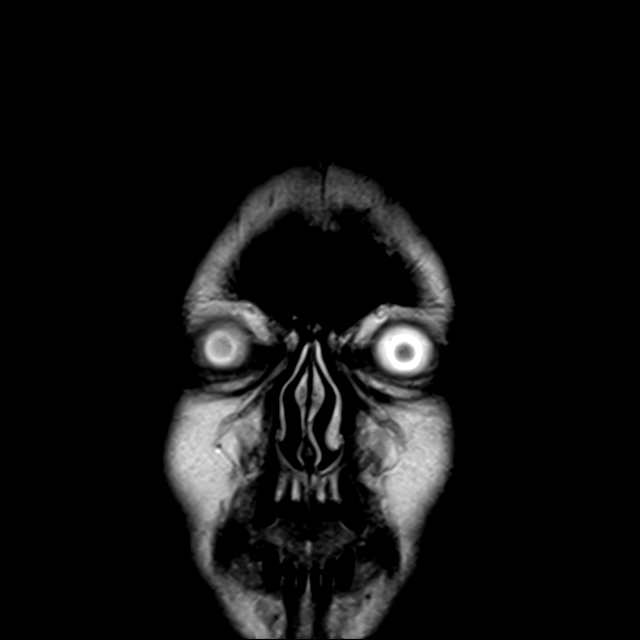
[im 31/31]
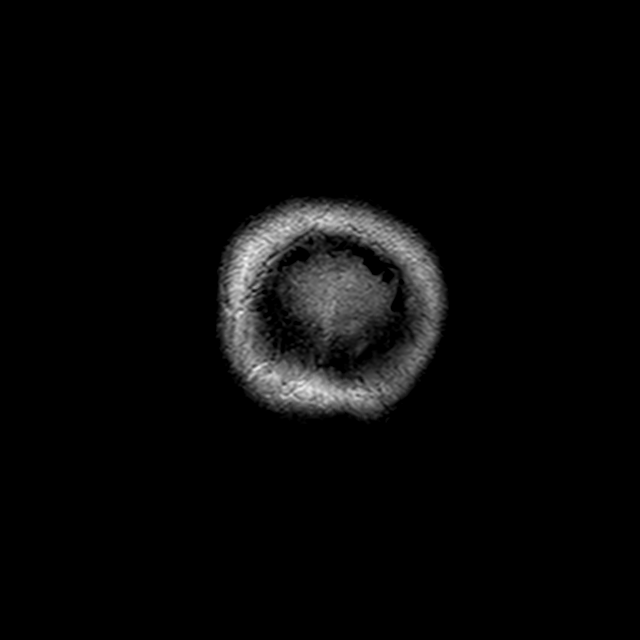

[32 of 48 positions shown; findings below may reference images not displayed]

FINDINGS: MRI HEAD FINDINGS

Brain: No acute infarction, hemorrhage, hydrocephalus, extra-axial
collection or mass lesion. No structural or signal abnormality of
the brain.

Vascular: Normal flow voids.

Skull and upper cervical spine: Normal marrow signal.

Sinuses/Orbits: Tiny right maxillary sinus mucous retention cyst.
Otherwise visible paranasal sinuses and the mastoid air cells
demonstrate normal signal. Orbits are unremarkable.

Other: None.

MRA HEAD FINDINGS

Mild motion artifact.

Internal carotid arteries:  Patent.

Anterior cerebral arteries:  Patent.

Middle cerebral arteries: Patent.

Anterior communicating artery: Patent.

Posterior communicating arteries:  Right fetal PCA.

Posterior cerebral arteries:  Patent.

Basilar artery:  Patent.

Vertebral arteries:  Patent.

No high-grade stenosis, large vessel occlusion, or aneurysm
identified.
IMPRESSION: 1. No acute intracranial abnormality. Unremarkable MRI of the brain.
2. Mild motion degradation of MRA of the head. No intracranial large
vessel occlusion, aneurysm, or high-grade stenosis.

## 2024-01-12 ENCOUNTER — Encounter: Payer: Self-pay | Admitting: Internal Medicine

## 2024-02-18 ENCOUNTER — Ambulatory Visit (AMBULATORY_SURGERY_CENTER): Payer: BC Managed Care – PPO

## 2024-02-18 VITALS — Ht 66.0 in | Wt 220.0 lb

## 2024-02-18 DIAGNOSIS — Z800 Family history of malignant neoplasm of digestive organs: Secondary | ICD-10-CM

## 2024-02-18 MED ORDER — NA SULFATE-K SULFATE-MG SULF 17.5-3.13-1.6 GM/177ML PO SOLN: 1.0000 | Freq: Once | ORAL | 0 refills | Status: AC

## 2024-02-18 NOTE — Progress Notes (Signed)
 No egg or soy allergy known to patient  No issues known to pt with past sedation with any surgeries or procedures Patient denies ever being told they had issues or difficulty with intubation  No FH of Malignant Hyperthermia Pt is not on diet pills Pt is not on home 02  Pt is not on blood thinners  Pt denies issues with chronic constipation  No A fib or A flutter Have any cardiac testing pending--no Ambulates independently

## 2024-02-23 ENCOUNTER — Encounter: Payer: Self-pay | Admitting: Internal Medicine

## 2024-02-24 NOTE — Progress Notes (Signed)
 [facility] History and Physical   Primary Care Physician:  [facility]   Reason for Procedure:   FHx CRCA + hx colon polyps  Plan:    colonoscopy     HPI: Edgar Hall is a 58 y.o. male s/p removal of 2 adenomas and 1 sessile serrated polyp in 2019 - colonoscopy by Dr. Russella Dar. He also has a FHx CRCA - father   Past Medical History:  Diagnosis Date   Abdominal hernia    GERD (gastroesophageal reflux disease)    Hyperlipidemia    Hypertension    TIA 01/2019    Past Surgical History:  Procedure Laterality Date   arm surgery     COLONOSCOPY     NOSE SURGERY      Prior to Admission medications   Medication Sig Start Date End Date Taking? Authorizing Provider  amLODipine (NORVASC) 5 MG tablet Take 1 tablet (5 mg total) by mouth daily. 02/11/19   Joseph Art, DO  aspirin EC 81 MG tablet Take 81 mg by mouth daily. Patient not taking: Reported on 02/18/2024    [provider]  busPIRone (BUSPAR) 10 MG tablet Take 1 tablet by mouth 2 (two) times daily as needed. 09/14/23   [provider]  Cholecalciferol (VITAMIN D PO) Take 1 tablet by mouth daily.     [provider]  escitalopram (LEXAPRO) 20 MG tablet Take 20 mg by mouth daily.    [provider]  fluticasone (FLONASE) 50 MCG/ACT nasal spray Place 1 spray into both nostrils daily as needed for allergies or rhinitis.  Patient not taking: Reported on 02/18/2024    [provider]  hydrOXYzine (ATARAX) 25 MG tablet Take 25 mg by mouth at bedtime as needed. 08/30/23   [provider]  lisinopril-hydrochlorothiazide (PRINZIDE,ZESTORETIC) 20-25 MG per tablet Take 1 tablet by mouth daily.    [provider]  Multiple Vitamin (MULTIVITAMIN) tablet Take 1 tablet by mouth daily.    [provider]  Omega-3 Fatty Acids (FISH OIL) 1000 MG CAPS Take 1,000 mg by mouth daily.    [provider]  omeprazole (PRILOSEC) 40 MG capsule  Take 40 mg by mouth daily.    [provider]  pravastatin (PRAVACHOL) 40 MG tablet Take 1 tablet by mouth every evening. 01/25/24   [provider]    Current Outpatient Medications  Medication Sig Dispense Refill   amLODipine (NORVASC) 5 MG tablet Take 1 tablet (5 mg total) by mouth daily. 30 tablet 0   busPIRone (BUSPAR) 10 MG tablet Take 1 tablet by mouth 2 (two) times daily as needed.     Cholecalciferol (VITAMIN D PO) Take 1 tablet by mouth daily.      escitalopram (LEXAPRO) 20 MG tablet Take 20 mg by mouth daily.     hydrOXYzine (ATARAX) 25 MG tablet Take 25 mg by mouth at bedtime as needed.     lisinopril-hydrochlorothiazide (PRINZIDE,ZESTORETIC) 20-25 MG per tablet Take 1 tablet by mouth daily.     Multiple Vitamin (MULTIVITAMIN) tablet Take 1 tablet by mouth daily.     Omega-3 Fatty Acids (FISH OIL) 1000 MG CAPS Take 1,000 mg by mouth daily.     omeprazole (PRILOSEC) 40 MG capsule Take 40 mg by mouth daily.     pravastatin (PRAVACHOL) 40 MG tablet Take 1 tablet by mouth every evening.     Current Facility-Administered Medications  Medication Dose Route Frequency Provider Last Rate Last Admin   0.9 %  sodium chloride infusion  500 mL Intravenous Once Iva Boop, MD        Allergies as of 02/25/2024   (No Known Allergies)    Family History  Problem Relation Age of Onset   COPD Mother    Colon cancer Father    Diabetes Father    Heart disease Father    Esophageal cancer Neg Hx    Rectal cancer Neg Hx    Stomach cancer Neg Hx     Social History   Socioeconomic History   Marital status: Married    Spouse name: Not on file   Number of children: 1   Years of education: Not on file   Highest education level: Not on file  Occupational History   Not on file  Tobacco Use   Smoking status: Former   Smokeless tobacco: Current    Types: Chew  Vaping Use   Vaping status: Never Used  Substance and Sexual Activity   Alcohol use: Yes     Alcohol/week: 7.0 standard drinks of alcohol    Types: 7 Cans of beer per week   Drug use: Not Currently   Sexual activity: Not on file  Other Topics Concern   Not on file  Social History Narrative   Not on file   Social Drivers of Health   Financial Resource Strain: Low Risk  (05/03/2023)   Received from [facility], [facility]   Overall Financial Resource Strain (CARDIA)    Difficulty of Paying Living Expenses: Not hard at all  Food Insecurity: No Food Insecurity (05/03/2023)   Received from [facility], [facility]   Hunger Vital Sign    Worried About Running Out of Food in the Last Year: Never true    Ran Out of Food in the Last Year: Never true  Transportation Needs: No Transportation Needs (05/03/2023)   Received from [facility], [facility]   PRAPARE - Transportation    Lack of Transportation (Medical): No    Lack of Transportation (Non-Medical): No  Physical Activity: Insufficiently Active (05/03/2023)   Received from [facility], [facility]   Exercise Vital Sign    Days of Exercise per Week: 1 day    Minutes of Exercise per Session: 20 min  Stress: No Stress Concern Present (05/03/2023)   Received from [facility], [facility] - Occupational Stress Questionnaire    Feeling of Stress : Not at all  Social Connections: Socially Integrated (05/03/2023)   Received from [facility], [facility]   Social Network    How would you rate your social network (family, work, friends)?: Good participation with social networks  Intimate Partner Violence: Not At Risk (05/03/2023)   Received from [facility], [facility]   HITS    Over the last 12 months how often did your partner physically hurt you?: Never    Over the last 12 months how often did your partner insult you or talk down to you?: Never    Over the last 12 months how often did your partner threaten you with physical harm?: Never    Over the last 12  months how often did your partner scream or curse at you?: Never    Review of Systems:  All other review of systems negative except as mentioned in the HPI.  Physical Exam: Vital signs BP (!) 144/88   Pulse 76   Temp 98.8 F (37.1 C)   Resp 10   Ht  5\' 6"  (1.676 m)   Wt 220 lb (99.8 kg)   SpO2 98%   BMI 35.51 kg/m   General:   Alert,  Well-developed, well-nourished, pleasant and cooperative in NAD Lungs:  Clear throughout to auscultation.   Heart:  Regular rate and rhythm; no murmurs, clicks, rubs,  or gallops. Abdomen:  Soft, nontender and nondistended. Normal bowel sounds.   Neuro/Psych:  Alert and cooperative. Normal mood and affect. A and O x 3   @Chasidy Janak  Sena Slate, MD, [facility] [phone] (pager) 02/25/2024 9:22 AM@

## 2024-02-25 ENCOUNTER — Encounter: Payer: Self-pay | Admitting: Internal Medicine

## 2024-02-25 ENCOUNTER — Ambulatory Visit: Payer: BC Managed Care – PPO | Admitting: Internal Medicine

## 2024-02-25 VITALS — BP 128/83 | HR 74 | Temp 98.8°F | Resp 16 | Ht 66.0 in | Wt 220.0 lb

## 2024-02-25 DIAGNOSIS — Z860100 Personal history of colon polyps, unspecified: Secondary | ICD-10-CM | POA: Insufficient documentation

## 2024-02-25 DIAGNOSIS — Z1211 Encounter for screening for malignant neoplasm of colon: Secondary | ICD-10-CM

## 2024-02-25 DIAGNOSIS — Z860101 Personal history of adenomatous and serrated colon polyps: Secondary | ICD-10-CM

## 2024-02-25 DIAGNOSIS — K5730 Diverticulosis of large intestine without perforation or abscess without bleeding: Secondary | ICD-10-CM | POA: Diagnosis not present

## 2024-02-25 DIAGNOSIS — D124 Benign neoplasm of descending colon: Secondary | ICD-10-CM

## 2024-02-25 DIAGNOSIS — Z800 Family history of malignant neoplasm of digestive organs: Secondary | ICD-10-CM

## 2024-02-25 DIAGNOSIS — D123 Benign neoplasm of transverse colon: Secondary | ICD-10-CM

## 2024-02-25 MED ORDER — SODIUM CHLORIDE 0.9 % IV SOLN: 500.0000 mL | Freq: Once | INTRAVENOUS | Status: DC

## 2024-02-25 NOTE — Progress Notes (Signed)
 Vss nad trans to pacu

## 2024-02-25 NOTE — Patient Instructions (Signed)
Handouts provided on polyps and diverticulosis.   Resume previous diet.  Continue present medications.  Await pathology results.  Repeat colonoscopy in 5 years for surveillance.   YOU HAD AN ENDOSCOPIC PROCEDURE TODAY AT [facility]:   Refer to the procedure report that was given to you for any specific questions about what was found during the examination.  If the procedure report does not answer your questions, please call your gastroenterologist to clarify.  If you requested that your care partner not be given the details of your procedure findings, then the procedure report has been included in a sealed envelope for you to review at your convenience later.  YOU SHOULD EXPECT: Some feelings of bloating in the abdomen. Passage of more gas than usual.  Walking can help get rid of the air that was put into your GI tract during the procedure and reduce the bloating. If you had a lower endoscopy (such as a colonoscopy or flexible sigmoidoscopy) you may notice spotting of blood in your stool or on the toilet paper. If you underwent a bowel prep for your procedure, you may not have a normal bowel movement for a few days.  Please Note:  You might notice some irritation and congestion in your nose or some drainage.  This is from the oxygen used during your procedure.  There is no need for concern and it should clear up in a day or so.  SYMPTOMS TO REPORT IMMEDIATELY:  Following lower endoscopy (colonoscopy or flexible sigmoidoscopy):  Excessive amounts of blood in the stool  Significant tenderness or worsening of abdominal pains  Swelling of the abdomen that is new, acute  Fever of 100F or higher  For urgent or emergent issues, a gastroenterologist can be reached at any hour by calling [phone]. Do not use MyChart messaging for urgent concerns.    DIET:  We do recommend a small meal at first, but then you may proceed to your regular diet.  Drink plenty of fluids but you  should avoid alcoholic beverages for 24 hours.  ACTIVITY:  You should plan to take it easy for the rest of today and you should NOT DRIVE or use heavy machinery until tomorrow (because of the sedation medicines used during the test).    FOLLOW UP: Our staff will call the number listed on your records the next business day following your procedure.  We will call around 7:15- 8:00 am to check on you and address any questions or concerns that you may have regarding the information given to you following your procedure. If we do not reach you, we will leave a message.     If any biopsies were taken you will be contacted by phone or by letter within the next 1-3 weeks.  Please call us at [phone] if you have not heard about the biopsies in 3 weeks.    SIGNATURES/CONFIDENTIALITY: You and/or your care partner have signed paperwork which will be entered into your electronic medical record.  These signatures attest to the fact that that the information above on your After Visit Summary has been reviewed and is understood.  Full responsibility of the confidentiality of this discharge information lies with you and/or your care-partner.

## 2024-02-25 NOTE — Op Note (Signed)
 [facility] Patient Name: Edgar Hall Procedure Date: 02/25/2024 9:14 AM MRN: [id number] Endoscopist: Iva Boop , MD, [id number] Age: 58 Referring MD:  Date of Birth: 05/18/1966 Gender: Male Account #: [id number] Procedure:                Colonoscopy Indications:              Surveillance: Personal history of adenomatous                            polyps on last colonoscopy > 5 years ago, Last                            colonoscopy: 2019Family Hx colon cancer - father                            died at age 13 Medicines:                Monitored Anesthesia Care Procedure:                Pre-Anesthesia Assessment:                           - Prior to the procedure, a History and Physical                            was performed, and patient medications and                            allergies were reviewed. The patient's tolerance of                            previous anesthesia was also reviewed. The risks                            and benefits of the procedure and the sedation                            options and risks were discussed with the patient.                            All questions were answered, and informed consent                            was obtained. Prior Anticoagulants: The patient has                            taken no anticoagulant or antiplatelet agents. ASA                            Grade Assessment: II - A patient with mild systemic                            disease. After reviewing the risks and benefits,  the patient was deemed in satisfactory condition to                            undergo the procedure.                           After obtaining informed consent, the colonoscope                            was passed under direct vision. Throughout the                            procedure, the patient's blood pressure, pulse, and                            oxygen saturations were monitored continuously. The                             CF [id number] #[id number] was introduced through the anus                            and advanced to the the cecum, identified by                            appendiceal orifice and ileocecal valve. The                            colonoscopy was performed without difficulty. The                            patient tolerated the procedure well. The quality                            of the bowel preparation was good. The ileocecal                            valve, appendiceal orifice, and rectum were                            photographed. The bowel preparation used was SUPREP                            via split dose instruction. Scope In: 9:24:46 AM Scope Out: 9:39:00 AM Scope Withdrawal Time: 0 hours 11 minutes 51 seconds  Total Procedure Duration: 0 hours 14 minutes 14 seconds  Findings:                 The perianal and digital rectal examinations were                            normal.                           Two sessile polyps were found in the proximal  descending colon. The polyps were diminutive in                            size. These polyps were removed with a cold snare.                            Resection and retrieval were complete. Verification                            of patient identification for the specimen was                            done. Estimated blood loss was minimal.                           A 1 mm polyp was found in the distal transverse                            colon. The polyp was sessile. The polyp was removed                            with a cold biopsy forceps. Resection and retrieval                            were complete. Verification of patient                            identification for the specimen was done. Estimated                            blood loss was minimal.                           Multiple diverticula were found in the sigmoid                            colon.                            The exam was otherwise without abnormality on                            direct and retroflexion views. Complications:            No immediate complications. Estimated Blood Loss:     Estimated blood loss was minimal. Impression:               - Two diminutive polyps in the proximal descending                            colon, removed with a cold snare. Resected and                            retrieved.                           -  One 1 mm polyp in the distal transverse colon,                            removed with a cold biopsy forceps. Resected and                            retrieved.                           - Diverticulosis in the sigmoid colon.                           - The examination was otherwise normal on direct                            and retroflexion views.                           - Personal history of colonic polyps. 2 adenomas                            and 1 ssp 2019 Recommendation:           - Patient has a contact number available for                            emergencies. The signs and symptoms of potential                            delayed complications were discussed with the                            patient. Return to normal activities tomorrow.                            Written discharge instructions were provided to the                            patient.                           - Resume previous diet.                           - Continue present medications.                           - Repeat colonoscopy in 5 years for surveillance. Iva Boop, MD 02/25/2024 9:46:06 AM This report has been signed electronically.

## 2024-02-25 NOTE — Progress Notes (Signed)
 Pt's states no medical or surgical changes since previsit or office visit.

## 2024-02-25 NOTE — Progress Notes (Signed)
 Called to room to assist during endoscopic procedure.  Patient ID and intended procedure confirmed with present staff. Received instructions for my participation in the procedure from the performing physician.

## 2024-02-28 ENCOUNTER — Telehealth: Payer: Self-pay

## 2024-02-28 NOTE — Telephone Encounter (Signed)
  Follow up Call-     02/25/2024    8:19 AM  Call back number  Post procedure Call Back phone  # [phone]  Permission to leave phone message Yes     Patient questions:  Do you have a fever, pain , or abdominal swelling? No. Pain Score  0 *  Have you tolerated food without any problems? Yes.    Have you been able to return to your normal activities? Yes.    Do you have any questions about your discharge instructions: Diet   No. Medications  No. Follow up visit  No.  Do you have questions or concerns about your Care? No.  Actions: * If pain score is 4 or above: No action needed, pain <4.

## 2024-02-29 LAB — SURGICAL PATHOLOGY

## 2024-03-03 ENCOUNTER — Encounter: Payer: Self-pay | Admitting: Internal Medicine
